# Patient Record
Sex: Male | Born: 1955 | Race: White | Hispanic: No | Marital: Married | Smoking: Former smoker
Health system: Southern US, Community
[De-identification: ages and names within clinical notes are randomized; demographics above are authoritative.]

## PROBLEM LIST (undated history)

## (undated) DIAGNOSIS — I10 Essential (primary) hypertension: Secondary | ICD-10-CM

## (undated) DIAGNOSIS — M1990 Unspecified osteoarthritis, unspecified site: Secondary | ICD-10-CM

## (undated) DIAGNOSIS — I499 Cardiac arrhythmia, unspecified: Secondary | ICD-10-CM

## (undated) DIAGNOSIS — G4730 Sleep apnea, unspecified: Secondary | ICD-10-CM

## (undated) DIAGNOSIS — E785 Hyperlipidemia, unspecified: Secondary | ICD-10-CM

## (undated) DIAGNOSIS — J45909 Unspecified asthma, uncomplicated: Secondary | ICD-10-CM

## (undated) HISTORY — DX: Essential (primary) hypertension: I10

## (undated) HISTORY — DX: Unspecified asthma, uncomplicated: J45.909

## (undated) HISTORY — PX: SINUS EXPLORATION: SHX5214

## (undated) HISTORY — PX: HERNIA REPAIR: SHX51

## (undated) HISTORY — DX: Hyperlipidemia, unspecified: E78.5

## (undated) HISTORY — PX: URETEROSCOPY: SHX842

---

## 1998-01-05 ENCOUNTER — Ambulatory Visit (HOSPITAL_BASED_OUTPATIENT_CLINIC_OR_DEPARTMENT_OTHER): Admission: RE | Admit: 1998-01-05 | Discharge: 1998-01-05 | Payer: Self-pay | Admitting: *Deleted

## 2012-04-30 ENCOUNTER — Other Ambulatory Visit: Payer: Self-pay

## 2012-04-30 ENCOUNTER — Ambulatory Visit
Admission: RE | Admit: 2012-04-30 | Discharge: 2012-04-30 | Disposition: A | Discharge status: Home / Self Care | Payer: 59 | Source: Ambulatory Visit

## 2012-04-30 DIAGNOSIS — L089 Local infection of the skin and subcutaneous tissue, unspecified: Secondary | ICD-10-CM

## 2013-02-21 ENCOUNTER — Encounter (INDEPENDENT_AMBULATORY_CARE_PROVIDER_SITE_OTHER): Payer: Self-pay | Admitting: General Surgery

## 2013-02-21 ENCOUNTER — Ambulatory Visit (INDEPENDENT_AMBULATORY_CARE_PROVIDER_SITE_OTHER): Payer: 59 | Admitting: General Surgery

## 2013-02-21 ENCOUNTER — Encounter (INDEPENDENT_AMBULATORY_CARE_PROVIDER_SITE_OTHER): Payer: Self-pay

## 2013-02-21 VITALS — BP 140/88 | HR 78 | Temp 97.0°F | Resp 14 | Ht 69.0 in | Wt 279.4 lb

## 2013-02-21 DIAGNOSIS — M6208 Separation of muscle (nontraumatic), other site: Secondary | ICD-10-CM

## 2013-02-21 DIAGNOSIS — M6200 Separation of muscle (nontraumatic), unspecified site: Secondary | ICD-10-CM

## 2013-02-21 NOTE — Patient Instructions (Signed)
The bulge that you see in your upper abdomen is not a hernia. 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  It is not a hernia, it should not cause pain, and it does not require surgery. Nothing further needs to be done.  Return to see Dr. Derrell Lolling as needed.

## 2013-02-21 NOTE — Progress Notes (Signed)
Patient ID: Donald [name], male   DOB: [birth year], 57 y.o.   MRN: [id number]  Chief Complaint  Patient presents with  . New Evaluation    eval abd hernia    HPI Donald [name] is a 57 y.o. male.  He is basically self-referred for a bulge in his upper abdomen.  He has noticed a bulge in his upper abdomen for some time. He notices this when he sits up but not when he is standing. There is no pain. It is not particularly progressive.  Past history of laparoscopic left inguinal hernia repair, possibly by Dr. Luan Pulling in the year 2000. This recurred and he went away for cement of the repair which has failed.  Past history significant for psoriasis, hypertension, asthma, hyperlipidemia, and obesity.  HPI  Past Medical History  Diagnosis Date  . Hyperlipidemia   . Hypertension   . Asthma     Past Surgical History  Procedure Laterality Date  . Sinus exploration    . Hernia repair      History reviewed. No pertinent family history.  Social History History  Substance Use Topics  . Smoking status: Former Smoker    Types: Cigarettes  . Smokeless tobacco: Never Used     Comment: patient quit smoking around 25  . Alcohol Use: Yes    Allergies  Allergen Reactions  . Prednisone Swelling  . Tetracyclines & Related Nausea Only    Current Outpatient Prescriptions  Medication Sig Dispense Refill  . amLODipine (NORVASC) 10 MG tablet       . hydrochlorothiazide (HYDRODIURIL) 25 MG tablet       . ibuprofen (ADVIL,MOTRIN) 200 MG tablet Take 200 mg by mouth every 6 (six) hours as needed for pain.      Marland Kitchen lisinopril (PRINIVIL,ZESTRIL) 40 MG tablet       . VECTICAL 3 MCG/GM cream        No current facility-administered medications for this visit.    Review of Systems Review of Systems  Constitutional: Negative for fever, chills and unexpected weight change.  HENT: Negative for congestion, hearing loss, sore throat, trouble swallowing and voice change.   Eyes: Negative for  visual disturbance.  Respiratory: Negative for cough and wheezing.   Cardiovascular: Negative for chest pain, palpitations and leg swelling.  Gastrointestinal: Negative for nausea, vomiting, abdominal pain, diarrhea, constipation, blood in stool, abdominal distention, anal bleeding and rectal pain.  Genitourinary: Negative for hematuria and difficulty urinating.  Musculoskeletal: Negative for arthralgias.  Skin: Negative for rash and wound.  Neurological: Negative for seizures, syncope, weakness and headaches.  Hematological: Negative for adenopathy. Does not bruise/bleed easily.  Psychiatric/Behavioral: Negative for confusion.    Blood pressure 140/88, pulse 78, temperature 97 F (36.1 C), temperature source Temporal, resp. rate 14, height 5\' 9"  (1.753 m), weight 279 lb 6.4 oz (126.735 kg).  Physical Exam Physical Exam  Constitutional: He is oriented to person, place, and time. He appears well-developed and well-nourished. No distress.  HENT:  Head: Normocephalic.  Nose: Nose normal.  Mouth/Throat: No oropharyngeal exudate.  Eyes: Conjunctivae and EOM are normal. Pupils are equal, round, and reactive to light. Right eye exhibits no discharge. Left eye exhibits no discharge. No scleral icterus.  Neck: Normal range of motion. Neck supple. No JVD present. No tracheal deviation present. No thyromegaly present.  Cardiovascular: Normal rate, regular rhythm, normal heart sounds and intact distal pulses.   No murmur heard. Pulmonary/Chest: Effort normal and breath sounds normal. No stridor. No respiratory distress.  He has no wheezes. He has no rales. He exhibits no tenderness.  Typical bulge of diastases recti in upper abdomen notable when lifting his head from supine position. This bulge does not present when he is standing or with cough or Valsalva. Small patch of psoriasis above the umbilicus. Small transverse incision below the umbilicus. No hernia there. Central obesity.  Abdominal: Soft.  Bowel sounds are normal. He exhibits no distension and no mass. There is no tenderness. There is no rebound and no guarding.  Genitourinary:  No inguinal hernia  Musculoskeletal: Normal range of motion. He exhibits no edema and no tenderness.  Lymphadenopathy:    He has no cervical adenopathy.  Neurological: He is alert and oriented to person, place, and time. He has normal reflexes. Coordination normal.  Skin: Skin is warm and dry. No rash noted. He is not diaphoretic. No erythema. No pallor.  Psychiatric: He has a normal mood and affect. His behavior is normal. Judgment and thought content normal.    Data Reviewed None  Assessment    Diastases recti. Asymptomatic.  Central obesity  Hypertension  Asthma  Hyperlipidemia  Psoriasis  Past history laparoscopic repair left inguinal hernia, probably about recurrence, necessitating subsequent open repair. No evidence of recurrence at this time.     Plan    I spent  some time discussing the diagnosis of a diastases recti with him. I discussed the anatomy and drew  pictures for him. He is aware that nothing further needs to be done about this, and is very comfortable with that  Return to see me as needed.        Angelia Mould. Derrell Lolling, M.D., [facility] General and Minimally invasive Surgery Breast and Colorectal Surgery Office:   [phone] Pager:   [phone]  02/21/2013, 12:06 PM

## 2013-11-29 ENCOUNTER — Ambulatory Visit (HOSPITAL_BASED_OUTPATIENT_CLINIC_OR_DEPARTMENT_OTHER): Payer: 59 | Attending: Family Medicine | Admitting: Radiology

## 2013-11-29 VITALS — Ht 69.0 in | Wt 275.0 lb

## 2013-11-29 DIAGNOSIS — G4733 Obstructive sleep apnea (adult) (pediatric): Secondary | ICD-10-CM | POA: Diagnosis not present

## 2013-12-04 DIAGNOSIS — G4733 Obstructive sleep apnea (adult) (pediatric): Secondary | ICD-10-CM

## 2013-12-04 NOTE — Sleep Study (Signed)
Hypersomnia with sleep apnea      NAME: Donald [name] DATE OF BIRTH:  December 26, 1955 MEDICAL RECORD NUMBER [id number]  LOCATION: [facility]  PHYSICIAN: Brydan Downard [name]  DATE OF STUDY: 11/29/2013  SLEEP STUDY TYPE: Nocturnal Polysomnogram               REFERRING PHYSICIAN: Farris HasMorrow, Aaron, MD  INDICATION FOR STUDY: Hypersomnia with sleep apnea  EPWORTH SLEEPINESS SCORE:   11/24 HEIGHT: 5\' 9"  (175.3 cm)  WEIGHT: 275 lb (124.739 kg)    Body mass index is 40.59 kg/(m^2).  NECK SIZE: 18.5 in.  MEDICATIONS: Charted for review  SLEEP ARCHITECTURE: Split study protocol. During the diagnostic phase, total sleep time 120.5 minutes with sleep efficiency 61.5%. Stage I was 13.7%, stage II 81.7%, stage III absent, REM 4.6% of total sleep time. Sleep latency 40.5 minutes, REM latency 106 minutes, awake after sleep onset 39 minutes, arousal index 69.7, bedtime medication: None  RESPIRATORY DATA: Apnea hypopneas index (AHI) 93.6 per hour. 188 total events scored including 115 obstructive apneas, 73 hypopneas. Events were not positional. REM AHI 87.3 per hour. CPAP titrated to 10 CWP, AHI 0 per hour. He wore a medium fullface mask.  OXYGEN DATA: Moderately loud snoring before CPAP with oxygen desaturation to a nadir of 74% on room air. With CPAP control, snoring was prevented and mean oxygen saturation was 94.6% on room air.  CARDIAC DATA: Normal sinus rhythm  MOVEMENT/PARASOMNIA: During the diagnostic phase, 25 limb jerks were counted of which 3 were associated with arousal or wakening for a periodic limb movement with arousal index of 1.5 per hour. During the titration phase, 173 limb jerks were counted of which 5 were associated with arousal or wakening for a periodic limb movement with arousal index of 1.8 per hour. Bathroom x1  IMPRESSION/ RECOMMENDATION:   1) Severe obstructive sleep apnea/hypopneas syndrome, AHI 93.6 per hour with non-positional events. REM AHI 87.3 per hour.  Moderately loud snoring with oxygen desaturation to a nadir of 74% on room air. 2) Successful CPAP titration to 10 CWP, AHI 0 per hour. He wore a medium ResMed AirFit F10 fullface mask with heated humidifier. Snoring was prevented and mean oxygen saturation held 94.6% on room air.   Waymon BudgeYOUNG,Donald [name] Diplomate, American Board of Sleep Medicine  ELECTRONICALLY SIGNED ON:  12/04/2013, 11:31 AM [facility] PH: [phone]   FX: [phone] ACCREDITED BY THE AMERICAN ACADEMY OF SLEEP MEDICINE

## 2014-02-22 ENCOUNTER — Other Ambulatory Visit (HOSPITAL_COMMUNITY): Payer: Self-pay | Admitting: Orthopaedic Surgery

## 2014-03-01 NOTE — Patient Instructions (Addendum)
Donald [name]  03/01/2014   Your procedure is scheduled on:  03/10/2014    Come thru the Cancer Center Entrance.    Follow the Signs to Short Stay Center at  0745      am  Call this number if you have problems the morning of surgery: [phone]   Remember:   Do not eat food or drink liquids after midnight.   Take these medicines the morning of surgery with A SIP OF WATER:Albuterol Inhaler if needed, Bring Inhaler with you, Amlodipine ( NOrvasc) , Hydrocodone if needed, Ocean Nasal Spray if needed     Do not wear jewelry,  Do not wear lotions, powders, or perfumes. deodorant.  . Men may shave face and neck.  Do not bring valuables to the hospital.  Contacts, dentures or bridgework may not be worn into surgery.  Leave suitcase in the car. After surgery it may be brought to your room.  For patients admitted to the hospital, checkout time is 11:00 AM the day of  discharge.          Please read over the following fact sheets that you were given: MRSA Information, coughing and deep breathing exercises, leg exercises            [place] - Preparing for Surgery Before surgery, you can play an important role.  Because skin is not sterile, your skin needs to be as free of germs as possible.  You can reduce the number of germs on your skin by washing with CHG (chlorahexidine gluconate) soap before surgery.  CHG is an antiseptic cleaner which kills germs and bonds with the skin to continue killing germs even after washing. Please DO NOT use if you have an allergy to CHG or antibacterial soaps.  If your skin becomes reddened/irritated stop using the CHG and inform your nurse when you arrive at Short Stay. Do not shave (including legs and underarms) for at least 48 hours prior to the first CHG shower.  You may shave your face/neck. Please follow these instructions carefully:  1.  Shower with CHG Soap the night before surgery and the  morning of Surgery.  2.  If you choose to wash your hair,  wash your hair first as usual with your  normal  shampoo.  3.  After you shampoo, rinse your hair and body thoroughly to remove the  shampoo.                           4.  Use CHG as you would any other liquid soap.  You can apply chg directly  to the skin and wash                       Gently with a scrungie or clean washcloth.  5.  Apply the CHG Soap to your body ONLY FROM THE NECK DOWN.   Do not use on face/ open                           Wound or open sores. Avoid contact with eyes, ears mouth and genitals (private parts).                       Wash face,  Genitals (private parts) with your normal soap.             6.  Wash thoroughly, paying  special attention to the area where your surgery  will be performed.  7.  Thoroughly rinse your body with warm water from the neck down.  8.  DO NOT shower/wash with your normal soap after using and rinsing off  the CHG Soap.                9.  Pat yourself dry with a clean towel.            10.  Wear clean pajamas.            11.  Place clean sheets on your bed the night of your first shower and do not  sleep with pets. Day of Surgery : Do not apply any lotions/deodorants the morning of surgery.  Please wear clean clothes to the hospital/surgery center.  FAILURE TO FOLLOW THESE INSTRUCTIONS MAY RESULT IN THE CANCELLATION OF YOUR SURGERY PATIENT SIGNATURE_________________________________  NURSE SIGNATURE__________________________________  ________________________________________________________________________  WHAT IS A BLOOD TRANSFUSION? Blood Transfusion Information  A transfusion is the replacement of blood or some of its parts. Blood is made up of multiple cells which provide different functions.  Red blood cells carry oxygen and are used for blood loss replacement.  White blood cells fight against infection.  Platelets control bleeding.  Plasma helps clot blood.  Other blood products are available for specialized needs, such as  hemophilia or other clotting disorders. BEFORE THE TRANSFUSION  Who gives blood for transfusions?   Healthy volunteers who are fully evaluated to make sure their blood is safe. This is blood bank blood. Transfusion therapy is the safest it has ever been in the practice of medicine. Before blood is taken from a donor, a complete history is taken to make sure that person has no history of diseases nor engages in risky social behavior (examples are intravenous drug use or sexual activity with multiple partners). The donor's travel history is screened to minimize risk of transmitting infections, such as malaria. The donated blood is tested for signs of infectious diseases, such as HIV and hepatitis. The blood is then tested to be sure it is compatible with you in order to minimize the chance of a transfusion reaction. If you or a relative donates blood, this is often done in anticipation of surgery and is not appropriate for emergency situations. It takes many days to process the donated blood. RISKS AND COMPLICATIONS Although transfusion therapy is very safe and saves many lives, the main dangers of transfusion include:   Getting an infectious disease.  Developing a transfusion reaction. This is an allergic reaction to something in the blood you were given. Every precaution is taken to prevent this. The decision to have a blood transfusion has been considered carefully by your caregiver before blood is given. Blood is not given unless the benefits outweigh the risks. AFTER THE TRANSFUSION  Right after receiving a blood transfusion, you will usually feel much better and more energetic. This is especially true if your red blood cells have gotten low (anemic). The transfusion raises the level of the red blood cells which carry oxygen, and this usually causes an energy increase.  The nurse administering the transfusion will monitor you carefully for complications. HOME CARE INSTRUCTIONS  No special  instructions are needed after a transfusion. You may find your energy is better. Speak with your caregiver about any limitations on activity for underlying diseases you may have. SEEK MEDICAL CARE IF:   Your condition is not improving after your transfusion.  You develop redness  or irritation at the intravenous (IV) site. SEEK IMMEDIATE MEDICAL CARE IF:  Any of the following symptoms occur over the next 12 hours:  Shaking chills.  You have a temperature by mouth above 102 F (38.9 C), not controlled by medicine.  Chest, back, or muscle pain.  People around you feel you are not acting correctly or are confused.  Shortness of breath or difficulty breathing.  Dizziness and fainting.  You get a rash or develop hives.  You have a decrease in urine output.  Your urine turns a dark color or changes to pink, red, or brown. Any of the following symptoms occur over the next 10 days:  You have a temperature by mouth above 102 F (38.9 C), not controlled by medicine.  Shortness of breath.  Weakness after normal activity.  The Waner part of the eye turns yellow (jaundice).  You have a decrease in the amount of urine or are urinating less often.  Your urine turns a dark color or changes to pink, red, or brown. Document Released: 04/11/2000 Document Revised: 07/07/2011 Document Reviewed: 11/29/2007 [facility] Patient Information 2014 [place], [place].  _______________________________________________________________________

## 2014-03-02 ENCOUNTER — Ambulatory Visit (HOSPITAL_COMMUNITY)
Admission: RE | Admit: 2014-03-02 | Discharge: 2014-03-02 | Disposition: A | Discharge status: Home / Self Care | Payer: 59 | Source: Ambulatory Visit | Attending: Orthopaedic Surgery | Admitting: Orthopaedic Surgery

## 2014-03-02 ENCOUNTER — Encounter (HOSPITAL_COMMUNITY): Payer: Self-pay

## 2014-03-02 ENCOUNTER — Encounter (HOSPITAL_COMMUNITY)
Admission: RE | Admit: 2014-03-02 | Discharge: 2014-03-02 | Disposition: A | Discharge status: Home / Self Care | Payer: 59 | Source: Ambulatory Visit | Attending: Orthopaedic Surgery | Admitting: Orthopaedic Surgery

## 2014-03-02 DIAGNOSIS — Z01818 Encounter for other preprocedural examination: Secondary | ICD-10-CM | POA: Insufficient documentation

## 2014-03-02 DIAGNOSIS — I10 Essential (primary) hypertension: Secondary | ICD-10-CM | POA: Diagnosis not present

## 2014-03-02 HISTORY — DX: Sleep apnea, unspecified: G47.30

## 2014-03-02 LAB — CBC
HEMATOCRIT: 43.4 % (ref 39.0–52.0)
Hemoglobin: 14.8 g/dL (ref 13.0–17.0)
MCH: 32.1 pg (ref 26.0–34.0)
MCHC: 34.1 g/dL (ref 30.0–36.0)
MCV: 94.1 fL (ref 78.0–100.0)
Platelets: 211 10*3/uL (ref 150–400)
RBC: 4.61 MIL/uL (ref 4.22–5.81)
RDW: 12.3 % (ref 11.5–15.5)
WBC: 7.7 10*3/uL (ref 4.0–10.5)

## 2014-03-02 LAB — BASIC METABOLIC PANEL
ANION GAP: 11 (ref 5–15)
BUN: 13 mg/dL (ref 6–23)
CALCIUM: 11.1 mg/dL — AB (ref 8.4–10.5)
CO2: 26 meq/L (ref 19–32)
Chloride: 93 mEq/L — ABNORMAL LOW (ref 96–112)
Creatinine, Ser: 0.81 mg/dL (ref 0.50–1.35)
GFR calc Af Amer: >90 mL/min (ref >90)
GFR calc non Af Amer: >90 mL/min (ref >90)
GLUCOSE: 101 mg/dL — AB (ref 70–99)
Potassium: 3.7 mEq/L (ref 3.7–5.3)
SODIUM: 130 meq/L — AB (ref 137–147)

## 2014-03-02 LAB — PROTIME-INR
INR: 1 (ref 0.00–1.49)
PROTHROMBIN TIME: 13.3 s (ref 11.6–15.2)

## 2014-03-02 LAB — URINALYSIS, ROUTINE W REFLEX MICROSCOPIC
Bilirubin Urine: NEGATIVE
Glucose, UA: NEGATIVE mg/dL
HGB URINE DIPSTICK: NEGATIVE
Ketones, ur: NEGATIVE mg/dL
NITRITE: NEGATIVE
PH: 7 (ref 5.0–8.0)
Protein, ur: NEGATIVE mg/dL
SPECIFIC GRAVITY, URINE: 1.013 (ref 1.005–1.030)
Urobilinogen, UA: 0.2 mg/dL (ref 0.0–1.0)

## 2014-03-02 LAB — URINE MICROSCOPIC-ADD ON

## 2014-03-02 LAB — APTT: aPTT: 28 seconds (ref 24–37)

## 2014-03-02 LAB — SURGICAL PCR SCREEN
MRSA, PCR: NEGATIVE
STAPHYLOCOCCUS AUREUS: NEGATIVE

## 2014-03-02 NOTE — Progress Notes (Signed)
Bmet, ua, micro results faxed to dr Cristal Deerchristopher blackman

## 2014-03-02 NOTE — Progress Notes (Signed)
Sleep Study documents 12/2013 in EPIC.

## 2014-03-03 NOTE — Progress Notes (Signed)
Urinalysis and micro results along with BMP results done 03/02/2014 sent via EPIC to Dr Doneen Poissonhristopher Blackman.

## 2014-03-09 MED ORDER — DEXTROSE 5 % IV SOLN
3.0000 g | INTRAVENOUS | Status: AC
  Administered 2014-03-10: 3000 mg via INTRAVENOUS
  Filled 2014-03-09: qty 3000

## 2014-03-09 NOTE — Anesthesia Preprocedure Evaluation (Signed)
Anesthesia Evaluation  Patient identified by MRN, date of birth, ID band Patient awake    Reviewed: Allergy & Precautions, H&P , NPO status , Patient's Chart, lab work & pertinent test results  History of Anesthesia Complications (+) history of anesthetic complications  Airway Mallampati: II  TM Distance: >3 FB Neck ROM: Full    Dental no notable dental hx. (+) Dental Advisory Given   Pulmonary asthma , sleep apnea , former smoker,  breath sounds clear to auscultation  Pulmonary exam normal       Cardiovascular hypertension, Pt. on medications Rhythm:Regular Rate:Normal     Neuro/Psych negative neurological ROS  negative psych ROS   GI/Hepatic negative GI ROS, Neg liver ROS,   Endo/Other  Morbid obesity  Renal/GU negative Renal ROS  negative genitourinary   Musculoskeletal negative musculoskeletal ROS (+)   Abdominal   Peds negative pediatric ROS (+)  Hematology negative hematology ROS (+)   Anesthesia Other Findings   Reproductive/Obstetrics negative OB ROS                             Anesthesia Physical Anesthesia Plan  ASA: III  Anesthesia Plan: General and Spinal   Post-op Pain Management:    Induction: Intravenous  Airway Management Planned: Oral ETT and Nasal Cannula  Additional Equipment:   Intra-op Plan:   Post-operative Plan: Extubation in OR  Informed Consent: I have reviewed the patients History and Physical, chart, labs and discussed the procedure including the risks, benefits and alternatives for the proposed anesthesia with the patient or authorized representative who has indicated his/her understanding and acceptance.   Dental advisory given  Plan Discussed with: CRNA  Anesthesia Plan Comments:         Anesthesia Quick Evaluation

## 2014-03-10 ENCOUNTER — Ambulatory Visit (HOSPITAL_COMMUNITY): Payer: 59 | Admitting: Anesthesiology

## 2014-03-10 ENCOUNTER — Inpatient Hospital Stay (HOSPITAL_COMMUNITY)
Admission: RE | Admit: 2014-03-10 | Discharge: 2014-03-11 | DRG: 470 | Disposition: A | Discharge status: Home Health | Payer: 59 | Source: Ambulatory Visit | Attending: Orthopaedic Surgery | Admitting: Orthopaedic Surgery

## 2014-03-10 ENCOUNTER — Ambulatory Visit (HOSPITAL_COMMUNITY): Payer: 59

## 2014-03-10 ENCOUNTER — Inpatient Hospital Stay (HOSPITAL_COMMUNITY): Payer: 59

## 2014-03-10 ENCOUNTER — Encounter (HOSPITAL_COMMUNITY)
Admission: RE | Disposition: A | Discharge status: Home Health | Payer: Self-pay | Source: Ambulatory Visit | Attending: Orthopaedic Surgery

## 2014-03-10 ENCOUNTER — Encounter (HOSPITAL_COMMUNITY): Payer: Self-pay | Admitting: *Deleted

## 2014-03-10 DIAGNOSIS — E785 Hyperlipidemia, unspecified: Secondary | ICD-10-CM | POA: Diagnosis present

## 2014-03-10 DIAGNOSIS — M659 Synovitis and tenosynovitis, unspecified: Secondary | ICD-10-CM | POA: Diagnosis present

## 2014-03-10 DIAGNOSIS — E669 Obesity, unspecified: Secondary | ICD-10-CM | POA: Diagnosis present

## 2014-03-10 DIAGNOSIS — Z6841 Body Mass Index (BMI) 40.0 and over, adult: Secondary | ICD-10-CM | POA: Diagnosis not present

## 2014-03-10 DIAGNOSIS — I10 Essential (primary) hypertension: Secondary | ICD-10-CM | POA: Diagnosis present

## 2014-03-10 DIAGNOSIS — M1611 Unilateral primary osteoarthritis, right hip: Secondary | ICD-10-CM | POA: Diagnosis present

## 2014-03-10 DIAGNOSIS — G4730 Sleep apnea, unspecified: Secondary | ICD-10-CM | POA: Diagnosis present

## 2014-03-10 DIAGNOSIS — Z87891 Personal history of nicotine dependence: Secondary | ICD-10-CM | POA: Diagnosis not present

## 2014-03-10 DIAGNOSIS — J9811 Atelectasis: Secondary | ICD-10-CM | POA: Diagnosis not present

## 2014-03-10 DIAGNOSIS — Z96641 Presence of right artificial hip joint: Secondary | ICD-10-CM

## 2014-03-10 DIAGNOSIS — M25551 Pain in right hip: Secondary | ICD-10-CM | POA: Diagnosis present

## 2014-03-10 DIAGNOSIS — Z419 Encounter for procedure for purposes other than remedying health state, unspecified: Secondary | ICD-10-CM

## 2014-03-10 HISTORY — PX: TOTAL HIP ARTHROPLASTY: SHX124

## 2014-03-10 LAB — ABO/RH: ABO/RH(D): O POS

## 2014-03-10 LAB — TYPE AND SCREEN
ABO/RH(D): O POS
Antibody Screen: NEGATIVE

## 2014-03-10 SURGERY — ARTHROPLASTY, HIP, TOTAL, ANTERIOR APPROACH
Anesthesia: General | Site: Hip | Laterality: Right

## 2014-03-10 MED ORDER — ACETAMINOPHEN 325 MG PO TABS: 650.0000 mg | ORAL_TABLET | Freq: Four times a day (QID) | ORAL | Status: DC | PRN

## 2014-03-10 MED ORDER — PHENOL 1.4 % MT LIQD
1.0000 | OROMUCOSAL | Status: DC | PRN
  Filled 2014-03-10: qty 177

## 2014-03-10 MED ORDER — MIDAZOLAM HCL 2 MG/2ML IJ SOLN
INTRAMUSCULAR | Status: AC
  Filled 2014-03-10: qty 2

## 2014-03-10 MED ORDER — HYDROMORPHONE HCL 1 MG/ML IJ SOLN
INTRAMUSCULAR | Status: DC | PRN
  Administered 2014-03-10 (×2): 1 mg via INTRAVENOUS

## 2014-03-10 MED ORDER — ROCURONIUM BROMIDE 100 MG/10ML IV SOLN
INTRAVENOUS | Status: DC | PRN
  Administered 2014-03-10 (×2): 30 mg via INTRAVENOUS

## 2014-03-10 MED ORDER — METHOCARBAMOL 500 MG PO TABS: 500.0000 mg | ORAL_TABLET | Freq: Four times a day (QID) | ORAL | Status: DC | PRN

## 2014-03-10 MED ORDER — ONDANSETRON HCL 4 MG/2ML IJ SOLN
INTRAMUSCULAR | Status: DC | PRN
  Administered 2014-03-10: 4 mg via INTRAVENOUS

## 2014-03-10 MED ORDER — FENTANYL CITRATE 0.05 MG/ML IJ SOLN
INTRAMUSCULAR | Status: DC | PRN
  Administered 2014-03-10 (×2): 100 ug via INTRAVENOUS
  Administered 2014-03-10: 50 ug via INTRAVENOUS

## 2014-03-10 MED ORDER — SODIUM CHLORIDE 0.9 % IR SOLN
Status: DC | PRN
  Administered 2014-03-10: 1000 mL

## 2014-03-10 MED ORDER — LIDOCAINE HCL (CARDIAC) 20 MG/ML IV SOLN
INTRAVENOUS | Status: DC | PRN
  Administered 2014-03-10: 50 mg via INTRAVENOUS

## 2014-03-10 MED ORDER — FENTANYL CITRATE 0.05 MG/ML IJ SOLN
INTRAMUSCULAR | Status: AC
  Filled 2014-03-10: qty 2

## 2014-03-10 MED ORDER — ROCURONIUM BROMIDE 100 MG/10ML IV SOLN
INTRAVENOUS | Status: AC
  Filled 2014-03-10: qty 1

## 2014-03-10 MED ORDER — CALCITRIOL 3 MCG/GM EX OINT: 1.0000 "application " | TOPICAL_OINTMENT | Freq: Every day | CUTANEOUS | Status: DC | PRN

## 2014-03-10 MED ORDER — ONDANSETRON HCL 4 MG/2ML IJ SOLN
4.0000 mg | Freq: Once | INTRAMUSCULAR | Status: DC | PRN
Start: 2014-03-10 — End: 2014-03-10

## 2014-03-10 MED ORDER — NEOSTIGMINE METHYLSULFATE 10 MG/10ML IV SOLN
INTRAVENOUS | Status: AC
  Filled 2014-03-10: qty 1

## 2014-03-10 MED ORDER — HYDROMORPHONE HCL 1 MG/ML IJ SOLN
1.0000 mg | INTRAMUSCULAR | Status: DC | PRN
  Administered 2014-03-10 (×2): 1 mg via INTRAVENOUS
  Filled 2014-03-10 (×2): qty 1

## 2014-03-10 MED ORDER — METOCLOPRAMIDE HCL 5 MG/ML IJ SOLN: 5.0000 mg | Freq: Three times a day (TID) | INTRAMUSCULAR | Status: DC | PRN

## 2014-03-10 MED ORDER — RIVAROXABAN 10 MG PO TABS
10.0000 mg | ORAL_TABLET | Freq: Every day | ORAL | Status: DC
  Administered 2014-03-11: 10 mg via ORAL
  Filled 2014-03-10 (×2): qty 1

## 2014-03-10 MED ORDER — METOCLOPRAMIDE HCL 10 MG PO TABS: 5.0000 mg | ORAL_TABLET | Freq: Three times a day (TID) | ORAL | Status: DC | PRN

## 2014-03-10 MED ORDER — MENTHOL 3 MG MT LOZG
1.0000 | LOZENGE | OROMUCOSAL | Status: DC | PRN
  Filled 2014-03-10: qty 9

## 2014-03-10 MED ORDER — ONDANSETRON HCL 4 MG/2ML IJ SOLN: 4.0000 mg | Freq: Four times a day (QID) | INTRAMUSCULAR | Status: DC | PRN

## 2014-03-10 MED ORDER — PROPOFOL 10 MG/ML IV BOLUS
INTRAVENOUS | Status: AC
Start: 2014-03-10 — End: 2014-03-10
  Filled 2014-03-10: qty 20

## 2014-03-10 MED ORDER — ALBUTEROL SULFATE (2.5 MG/3ML) 0.083% IN NEBU: 2.5000 mg | INHALATION_SOLUTION | Freq: Four times a day (QID) | RESPIRATORY_TRACT | Status: DC | PRN

## 2014-03-10 MED ORDER — ADULT MULTIVITAMIN W/MINERALS CH: 1.0000 | ORAL_TABLET | Freq: Every morning | ORAL | Status: DC

## 2014-03-10 MED ORDER — SUCCINYLCHOLINE CHLORIDE 20 MG/ML IJ SOLN
INTRAMUSCULAR | Status: DC | PRN
  Administered 2014-03-10: 100 mg via INTRAVENOUS

## 2014-03-10 MED ORDER — AMLODIPINE BESYLATE 10 MG PO TABS
10.0000 mg | ORAL_TABLET | Freq: Every morning | ORAL | Status: DC
  Administered 2014-03-11: 10 mg via ORAL
  Filled 2014-03-10: qty 1

## 2014-03-10 MED ORDER — PROPOFOL 10 MG/ML IV BOLUS
INTRAVENOUS | Status: DC | PRN
  Administered 2014-03-10: 200 mg via INTRAVENOUS

## 2014-03-10 MED ORDER — TRANEXAMIC ACID 100 MG/ML IV SOLN
1000.0000 mg | INTRAVENOUS | Status: AC
  Administered 2014-03-10: 1000 mg via INTRAVENOUS
  Filled 2014-03-10: qty 10

## 2014-03-10 MED ORDER — SODIUM CHLORIDE 0.9 % IV SOLN
INTRAVENOUS | Status: DC
  Administered 2014-03-10: 75 mL/h via INTRAVENOUS

## 2014-03-10 MED ORDER — ONDANSETRON HCL 4 MG PO TABS
4.0000 mg | ORAL_TABLET | Freq: Four times a day (QID) | ORAL | Status: DC | PRN
Start: 2014-03-10 — End: 2014-03-11

## 2014-03-10 MED ORDER — LACTATED RINGERS IV SOLN
INTRAVENOUS | Status: DC
  Administered 2014-03-10 (×2): 1000 mL via INTRAVENOUS
  Administered 2014-03-10: 11:00:00 via INTRAVENOUS

## 2014-03-10 MED ORDER — DEXTROSE 5 % IV SOLN
500.0000 mg | Freq: Four times a day (QID) | INTRAVENOUS | Status: DC | PRN
  Administered 2014-03-10: 500 mg via INTRAVENOUS
  Filled 2014-03-10 (×2): qty 5

## 2014-03-10 MED ORDER — ONDANSETRON HCL 4 MG/2ML IJ SOLN
INTRAMUSCULAR | Status: AC
  Filled 2014-03-10: qty 2

## 2014-03-10 MED ORDER — NEOSTIGMINE METHYLSULFATE 10 MG/10ML IV SOLN
INTRAVENOUS | Status: DC | PRN
  Administered 2014-03-10: 4 mg via INTRAVENOUS

## 2014-03-10 MED ORDER — GLYCOPYRROLATE 0.2 MG/ML IJ SOLN
INTRAMUSCULAR | Status: DC | PRN
  Administered 2014-03-10: .6 mg via INTRAVENOUS

## 2014-03-10 MED ORDER — HYDROCHLOROTHIAZIDE 25 MG PO TABS
25.0000 mg | ORAL_TABLET | Freq: Every morning | ORAL | Status: DC
  Administered 2014-03-10 – 2014-03-11 (×2): 25 mg via ORAL
  Filled 2014-03-10 (×2): qty 1

## 2014-03-10 MED ORDER — DIPHENHYDRAMINE HCL 12.5 MG/5ML PO ELIX: 12.5000 mg | ORAL_SOLUTION | ORAL | Status: DC | PRN

## 2014-03-10 MED ORDER — FENTANYL CITRATE 0.05 MG/ML IJ SOLN
INTRAMUSCULAR | Status: AC
  Filled 2014-03-10: qty 5

## 2014-03-10 MED ORDER — ZOLPIDEM TARTRATE 5 MG PO TABS: 5.0000 mg | ORAL_TABLET | Freq: Every evening | ORAL | Status: DC | PRN

## 2014-03-10 MED ORDER — FERROUS SULFATE 325 (65 FE) MG PO TABS
325.0000 mg | ORAL_TABLET | Freq: Three times a day (TID) | ORAL | Status: DC
  Administered 2014-03-11 (×2): 325 mg via ORAL
  Filled 2014-03-10 (×6): qty 1

## 2014-03-10 MED ORDER — GLYCOPYRROLATE 0.2 MG/ML IJ SOLN
INTRAMUSCULAR | Status: AC
  Filled 2014-03-10: qty 3

## 2014-03-10 MED ORDER — HYDROMORPHONE HCL 2 MG/ML IJ SOLN
INTRAMUSCULAR | Status: AC
Start: 2014-03-10 — End: 2014-03-10
  Filled 2014-03-10: qty 1

## 2014-03-10 MED ORDER — 0.9 % SODIUM CHLORIDE (POUR BTL) OPTIME
TOPICAL | Status: DC | PRN
  Administered 2014-03-10: 1000 mL

## 2014-03-10 MED ORDER — ACETAMINOPHEN 650 MG RE SUPP: 650.0000 mg | Freq: Four times a day (QID) | RECTAL | Status: DC | PRN

## 2014-03-10 MED ORDER — ALUM & MAG HYDROXIDE-SIMETH 200-200-20 MG/5ML PO SUSP: 30.0000 mL | ORAL | Status: DC | PRN

## 2014-03-10 MED ORDER — FENTANYL CITRATE 0.05 MG/ML IJ SOLN
25.0000 ug | INTRAMUSCULAR | Status: DC | PRN
  Administered 2014-03-10 (×3): 50 ug via INTRAVENOUS

## 2014-03-10 MED ORDER — MIDAZOLAM HCL 5 MG/5ML IJ SOLN
INTRAMUSCULAR | Status: DC | PRN
  Administered 2014-03-10: 2 mg via INTRAVENOUS

## 2014-03-10 MED ORDER — CALCIPOTRIENE-BETAMETH DIPROP 0.005-0.064 % EX SUSP: 1.0000 "application " | Freq: Every day | CUTANEOUS | Status: DC | PRN

## 2014-03-10 MED ORDER — OXYCODONE HCL 5 MG PO TABS
5.0000 mg | ORAL_TABLET | ORAL | Status: DC | PRN
  Administered 2014-03-10: 15 mg via ORAL
  Administered 2014-03-10: 10 mg via ORAL
  Administered 2014-03-10 – 2014-03-11 (×6): 15 mg via ORAL
  Filled 2014-03-10 (×2): qty 3
  Filled 2014-03-10: qty 2
  Filled 2014-03-10 (×2): qty 3
  Filled 2014-03-10: qty 2
  Filled 2014-03-10: qty 3
  Filled 2014-03-10: qty 1
  Filled 2014-03-10: qty 2
  Filled 2014-03-10: qty 1

## 2014-03-10 MED ORDER — POLYETHYLENE GLYCOL 3350 17 G PO PACK: 17.0000 g | PACK | Freq: Every day | ORAL | Status: DC | PRN

## 2014-03-10 MED ORDER — ALBUTEROL SULFATE (2.5 MG/3ML) 0.083% IN NEBU: 2.5000 mg | INHALATION_SOLUTION | RESPIRATORY_TRACT | Status: DC | PRN

## 2014-03-10 MED ORDER — LIDOCAINE HCL (CARDIAC) 20 MG/ML IV SOLN
INTRAVENOUS | Status: AC
  Filled 2014-03-10: qty 5

## 2014-03-10 MED ORDER — DOCUSATE SODIUM 100 MG PO CAPS
100.0000 mg | ORAL_CAPSULE | Freq: Two times a day (BID) | ORAL | Status: DC
  Administered 2014-03-10 – 2014-03-11 (×2): 100 mg via ORAL

## 2014-03-10 MED ORDER — CEFAZOLIN SODIUM-DEXTROSE 2-3 GM-% IV SOLR
2.0000 g | Freq: Four times a day (QID) | INTRAVENOUS | Status: AC
  Administered 2014-03-10 (×2): 2 g via INTRAVENOUS
  Filled 2014-03-10 (×2): qty 50

## 2014-03-10 SURGICAL SUPPLY — 45 items
BAG ZIPLOCK 12X15 (MISCELLANEOUS) IMPLANT
BENZOIN TINCTURE PRP APPL 2/3 (GAUZE/BANDAGES/DRESSINGS) IMPLANT
BLADE SAW SGTL 18X1.27X75 (BLADE) ×2 IMPLANT
BLADE SAW SGTL 18X1.27X75MM (BLADE) ×1
CAPT HIP PF COP ×3 IMPLANT
CELLS DAT CNTRL 66122 CELL SVR (MISCELLANEOUS) ×1 IMPLANT
CLOSURE WOUND 1/2 X4 (GAUZE/BANDAGES/DRESSINGS)
COVER PERINEAL POST (MISCELLANEOUS) ×3 IMPLANT
DRAPE C-ARM 42X120 X-RAY (DRAPES) ×3 IMPLANT
DRAPE STERI IOBAN 125X83 (DRAPES) ×3 IMPLANT
DRAPE U-SHAPE 47X51 STRL (DRAPES) ×9 IMPLANT
DRSG AQUACEL AG ADV 3.5X10 (GAUZE/BANDAGES/DRESSINGS) ×3 IMPLANT
DURAPREP 26ML APPLICATOR (WOUND CARE) ×3 IMPLANT
ELECT BLADE TIP CTD 4 INCH (ELECTRODE) ×3 IMPLANT
ELECT REM PT RETURN 9FT ADLT (ELECTROSURGICAL) ×3
ELECTRODE REM PT RTRN 9FT ADLT (ELECTROSURGICAL) ×1 IMPLANT
FACESHIELD WRAPAROUND (MASK) ×12 IMPLANT
GAUZE XEROFORM 1X8 LF (GAUZE/BANDAGES/DRESSINGS) ×3 IMPLANT
GLOVE BIO SURGEON STRL SZ7.5 (GLOVE) ×3 IMPLANT
GLOVE BIO SURGEON STRL SZ8 (GLOVE) ×3 IMPLANT
GLOVE BIOGEL PI IND STRL 7.5 (GLOVE) ×1 IMPLANT
GLOVE BIOGEL PI IND STRL 8 (GLOVE) ×2 IMPLANT
GLOVE BIOGEL PI IND STRL 8.5 (GLOVE) ×1 IMPLANT
GLOVE BIOGEL PI INDICATOR 7.5 (GLOVE) ×2
GLOVE BIOGEL PI INDICATOR 8 (GLOVE) ×4
GLOVE BIOGEL PI INDICATOR 8.5 (GLOVE) ×2
GLOVE ECLIPSE 8.0 STRL XLNG CF (GLOVE) ×3 IMPLANT
GLOVE SURG SS PI 7.5 STRL IVOR (GLOVE) ×3 IMPLANT
GOWN BRE IMP PREV XXLGXLNG (GOWN DISPOSABLE) ×3 IMPLANT
GOWN STRL REUS W/TWL XL LVL3 (GOWN DISPOSABLE) ×9 IMPLANT
HANDPIECE INTERPULSE COAX TIP (DISPOSABLE) ×2
KIT BASIN OR (CUSTOM PROCEDURE TRAY) ×3 IMPLANT
PACK TOTAL JOINT (CUSTOM PROCEDURE TRAY) ×3 IMPLANT
RTRCTR WOUND ALEXIS 18CM MED (MISCELLANEOUS) ×3
SET HNDPC FAN SPRY TIP SCT (DISPOSABLE) ×1 IMPLANT
STAPLER VISISTAT 35W (STAPLE) ×3 IMPLANT
STRIP CLOSURE SKIN 1/2X4 (GAUZE/BANDAGES/DRESSINGS) IMPLANT
SUT ETHIBOND NAB CT1 #1 30IN (SUTURE) ×3 IMPLANT
SUT MNCRL AB 4-0 PS2 18 (SUTURE) IMPLANT
SUT VIC AB 0 CT1 36 (SUTURE) ×3 IMPLANT
SUT VIC AB 1 CT1 36 (SUTURE) ×3 IMPLANT
SUT VIC AB 2-0 CT1 27 (SUTURE) ×4
SUT VIC AB 2-0 CT1 TAPERPNT 27 (SUTURE) ×2 IMPLANT
TOWEL OR 17X26 10 PK STRL BLUE (TOWEL DISPOSABLE) ×3 IMPLANT
TOWEL OR NON WOVEN STRL DISP B (DISPOSABLE) ×3 IMPLANT

## 2014-03-10 NOTE — Progress Notes (Signed)
Utilization review completed.  

## 2014-03-10 NOTE — Plan of Care (Signed)
Problem: Phase I Progression Outcomes Goal: CMS/Neurovascular status WDL Outcome: Completed/Met Date Met:  03/10/14     

## 2014-03-10 NOTE — Op Note (Signed)
Donald [name]:  Ahn, Corwyn               ACCOUNT NO.:  [id number]  MEDICAL RECORD NO.:  [id number]  LOCATION:  1601                         [facility]  PHYSICIAN:  Vanita PandaChristopher Y. 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  PROCEDURE:  Right total hip arthroplasty through direct anterior approach.  IMPLANTS:  DePuy Sector Gription acetabular component size 54 with apex hole eliminator and a single screw, size 36+ 4 neutral polyethylene liner for a size 54 acetabular component, size 12 Corail femoral component with varus offset (ALA), size 36+ 1.5 ceramic hip ball.  SURGEON:  Vanita PandaChristopher Y. Magnus IvanBlackman, MD  ASSISTANT:  Richardean CanalGilbert Clark, PA  ANESTHESIA:  General.  BLOOD LOSS:  150 mL.  ANTIBIOTICS:  3 g IV Ancef.  COMPLICATIONS:  None.  INDICATIONS:  [name] is a 58 year old gentleman well known to me. He has about a 3 year history of worsening right hip pain especially over the last year.  He has psoriasis and likely psoriatic arthritis versus primary osteoarthritis.  He has x-rays that show complete loss of his joint space on his right hip with significant sclerotic changes, periarticular osteophytes, and even some cystic changes.  His pain is daily, his mobility is limited, and his activities of daily living are impacted.  He says his quality of life is now impacted.  At this point, he wished to proceed with a total hip arthroplasty.  He understands the risks of acute blood loss anemia, nerve and vessel injury, fracture, infection, DVT, dislocation.  He understands the goals are to decrease pain, improve mobility, and overall improve quality of life.  PROCEDURE DESCRIPTION:  After informed consent was  obtained, appropriate right hip was marked, he was brought to the operating room and general anesthesia was obtained while he was on a stretcher.  Traction boots were placed on both of his feet.  Next, he was placed supine on the Hana fracture table with a perineal post in place and both legs in inline skeletal traction devices but no traction applied.  His right operative hip was then prepped and draped with DuraPrep and sterile drapes.  A time-out was called to identify correct patient, correct right hip.  We then made an incision inferior and posterior to the anterior-superior iliac spine and carried this obliquely down the leg.  I dissected down to the tensor fascia lata muscle, and then the tensor fascia was divided longitudinally so we could proceed with a direct anterior approach to the hip.  We identified and cauterized the lateral femoral circumflex vessels and then placed a Cobra retractor around the lateral neck and up underneath the rectus femoris a Cobra retractor medially.  I then opened up the hip capsule in an L-type format finding a very large joint effusion and significant synovitis suggesting an inflammatory process such as psoriatic arthritis.  We then placed the Cobra retractors within the hip capsule and made our femoral neck cut with an oscillating saw just proximal to the  lesser trochanter and completed this with an osteotome.  I placed a corkscrew guide in the femoral head and removed the femoral head in its entirety and found it to be completely devoid of cartilage.  I then cleaned the acetabular debris and remnants of acetabular labrum.  I placed a bent Hohmann medially and a Cobra retractor laterally and then began reaming in 2 mm increments from a size 42 all the way up to a size 54 with all reamers under direct visualization and the last reamer also under direct fluoroscopy so we could obtain our depth of reaming, our inclination, and anteversion.  I then  placed the real DePuy Sector Gription acetabular component size 54 with apex hole eliminator guide and single screw.  Attention was then turned to the femur.  With the leg externally rotated to 100 degrees and extended and adducted we were able to place a Mueller retractor medially and a Hohmann retractor behind the greater trochanter.  I released the lateral joint capsule and the piriformis and used a box cutting osteotome to enter the femoral canal and a rongeur to lateralize and then began broaching from a size 8 broached up to a size 12.  With the size 12 we trialed a standard neck and a 36+ 1.5 hip ball.  We brought the leg back up in open traction and internal rotation reduced in the pelvis and felt like it was slightly long in height but otherwise was stable.  We then dislocated the hip and removed the trial components. At that point, I lateralize a little bit more and then decided to place a real size 12 stem but with varus offset decrease his length and improve his offset.  We placed the real Corail femoral component size 12 with a varus offset into the femoral canal nice and tightly, so then we went with a real 36+ 1.5 ceramic hip ball.  We reduced this in the pelvis and he was stable on rotation.  His leg lengths and offset were measured near equal under direct fluoroscopy.  I then copiously irrigated the soft tissues with normal saline solution using pulsatile lavage, closed the joint capsule with interrupted #1 Ethibond suture followed by a running #1 Vicryl in the tensor fascia, 0 Vicryl in the deep tissue, 2-0 Vicryl in subcutaneous tissue, staples on the skin, and Aquacel dressing was applied.  He was then taken off the Hana table, awakened, extubated, and taken to recovery room in stable condition. All final counts were correct.  There were no complications noted.  Of note, Rexene EdisonGil Clark, PA-C assisted during the entire case and his assistance was crucial for facilitating all  aspects of this case.     Vanita Pandahristopher Y. Magnus IvanBlackman, M.D.     CYB/MEDQ  D:  03/10/2014  T:  03/10/2014  Job:  [id number]

## 2014-03-10 NOTE — Plan of Care (Signed)
Problem: Phase I Progression Outcomes Goal: Other Phase I Outcomes/Goals Outcome: Not Applicable Date Met:  40/69/86

## 2014-03-10 NOTE — Evaluation (Signed)
Physical Therapy Evaluation Patient Details Name: Donald SamsDwight E [name] MRN: [id number] DOB: Jul 30, 1955 Today's Date: 03/10/2014   History of Present Illness  R THR  Clinical Impression  Pt s/p R THR presents with decreased R LE strength/ROM and post op pain limiting functional mobility.  Pt should progress to d/c home with family assist and HHPT follow up.    Follow Up Recommendations Home health PT    Equipment Recommendations  None recommended by PT    Recommendations for Other Services OT consult     Precautions / Restrictions Precautions Precautions: Fall Restrictions Weight Bearing Restrictions: No Other Position/Activity Restrictions: WBAT      Mobility  Bed Mobility Overal bed mobility: Needs Assistance Bed Mobility: Supine to Sit     Supine to sit: Min assist;Mod assist     General bed mobility comments: cues for sequence and use of L LE to self assist  Transfers Overall transfer level: Needs assistance Equipment used: Rolling walker (2 wheeled) Transfers: Sit to/from Stand Sit to Stand: Mod assist         General transfer comment: cues for LE management and use of UEs to self assist  Ambulation/Gait Ambulation/Gait assistance: Min assist;Mod assist Ambulation Distance (Feet): 34 Feet Assistive device: Rolling walker (2 wheeled) Gait Pattern/deviations: Step-to pattern;Decreased step length - right;Decreased step length - left;Shuffle;Trunk flexed Gait velocity: decr   General Gait Details: cues for sequence, posture and position from [employer]  Stairs            Wheelchair Mobility    Modified Rankin (Stroke Patients Only)       Balance                                             Pertinent Vitals/Pain Pain Assessment: 0-10 Pain Score: 6  Pain Location: R hip Pain Descriptors / Indicators: Aching;Sore Pain Intervention(s): Limited activity within patient's tolerance;Monitored during session;Premedicated before  session;Ice applied    Home Living Family/patient expects to be discharged to:: Private residence Living Arrangements: Spouse/significant other Available Help at Discharge: Family Type of Home: House Home Access: Stairs to enter Entrance Stairs-Rails: Right;Left;Can reach both Secretary/administratorntrance Stairs-Number of Steps: 4 Home Layout: Two level Home Equipment: Environmental consultantWalker - 2 wheels      Prior Function Level of Independence: Independent               Hand Dominance   Dominant Hand: Right    Extremity/Trunk Assessment   Upper Extremity Assessment: Overall WFL for tasks assessed           Lower Extremity Assessment: RLE deficits/detail RLE Deficits / Details: 2+/5 hip strength with AAROM at hip to 85 flex and 15 abd    Cervical / Trunk Assessment: Normal  Communication   Communication: No difficulties  Cognition Arousal/Alertness: Awake/alert Behavior During Therapy: WFL for tasks assessed/performed Overall Cognitive Status: Within Functional Limits for tasks assessed                      General Comments      Exercises Total Joint Exercises Ankle Circles/Pumps: AROM;Both;15 reps;Supine Quad Sets: AROM;Both;10 reps;Supine Heel Slides: AAROM;Right;15 reps;Supine Hip ABduction/ADduction: AAROM;Right;10 reps;Supine      Assessment/Plan    PT Assessment Patient needs continued PT services  PT Diagnosis Difficulty walking   PT Problem List Decreased strength;Decreased range of motion;Decreased activity tolerance;Decreased mobility;Decreased knowledge of  use of DME;Obesity;Pain  PT Treatment Interventions DME instruction;Gait training;Stair training;Functional mobility training;Therapeutic activities;Therapeutic exercise;Patient/family education   PT Goals (Current goals can be found in the Care Plan section) Acute Rehab PT Goals Patient Stated Goal: Train on my new elyptical PT Goal Formulation: With patient Time For Goal Achievement: 03/17/14 Potential to  Achieve Goals: Good    Frequency 7X/week   Barriers to discharge        Co-evaluation               End of Session Equipment Utilized During Treatment: Gait belt Activity Tolerance: Patient tolerated treatment well Patient left: in chair;with call bell/phone within reach;with family/visitor present Nurse Communication: Mobility status         Time: [id number] PT Time Calculation (min) (ACUTE ONLY): 35 min   Charges:   PT Evaluation $Initial PT Evaluation Tier I: 1 Procedure PT Treatments $Gait Training: 8-22 mins $Therapeutic Exercise: 8-22 mins   PT G Codes:          Kalia Vahey 03/10/2014, 4:39 PM

## 2014-03-10 NOTE — H&P (Signed)
TOTAL HIP ADMISSION H&P  Patient is admitted for right total hip arthroplasty.  Subjective:  Chief Complaint: right hip pain  HPI: Donald Donald [name], 58 y.o. male, has a history of pain and functional disability in the right hip(s) due to primary osteo- arthritis and patient has failed non-surgical conservative treatments for greater than 12 weeks to include NSAID's and/or analgesics, corticosteriod injections, flexibility and strengthening excercises, use of assistive devices, weight reduction as appropriate and activity modification.  Onset of symptoms was gradual starting 3 years ago with gradually worsening course since that time.The patient noted no past surgery on the right hip(s).  Patient currently rates pain in the right hip at 10 out of 10 with activity. Patient has night pain, worsening of pain with activity and weight bearing, pain that interfers with activities of daily living and pain with passive range of motion. Patient has evidence of subchondral sclerosis, periarticular osteophytes and joint space narrowing by imaging studies. This condition presents safety issues increasing the risk of falls.  There is no current active infection.  Patient Active Problem List   Diagnosis Date Noted  . Primary osteoarthritis of right hip 03/10/2014  . Diastasis recti 02/21/2013   Past Medical History  Diagnosis Date  . Hyperlipidemia   . Hypertension   . Asthma   . Sleep apnea     cpap settings ? 10     Past Surgical History  Procedure Laterality Date  . Sinus exploration    . Ureteroscopy      multiple times   . Hernia repair      x 2     No prescriptions prior to admission   Allergies  Allergen Reactions  . Ibuprofen Swelling    Ankle and lower leg.(when took over long period of time)  . Prednisone Swelling  . Tetracyclines & Related Nausea Only    History  Substance Use Topics  . Smoking status: Former Smoker    Types: Cigarettes  . Smokeless tobacco: Never Used   Comment: patient quit smoking around 25  . Alcohol Use: 21.0 oz/week    21 Cans of beer, 14 Shots of liquor per week    No family history on file.   Review of Systems  Musculoskeletal: Positive for joint pain.  All other systems reviewed and are negative.   Objective:  Physical Exam  Constitutional: He is oriented to person, place, and time. He appears well-developed and well-nourished.  HENT:  Head: Normocephalic and atraumatic.  Eyes: EOM are normal. Pupils are equal, round, and reactive to light.  Neck: Normal range of motion. Neck supple.  Cardiovascular: Normal rate and regular rhythm.   Respiratory: Effort normal and breath sounds normal.  GI: Soft. Bowel sounds are normal.  Musculoskeletal:       Right hip: He exhibits decreased range of motion, decreased strength, tenderness and bony tenderness.  Neurological: He is alert and oriented to person, place, and time.  Skin: Skin is warm and dry.  Psychiatric: He has a normal mood and affect.    Vital signs in last 24 hours:    Labs:   Estimated body mass index is 40.59 kg/(m^2) as calculated from the following:   Height as of 11/29/13: 5\' 9"  (1.753 m).   Weight as of 11/29/13: 124.739 kg (275 lb).   Imaging Review Plain radiographs demonstrate severe degenerative joint disease of the right hip(s). The bone quality appears to be good for age and reported activity level.  Assessment/Plan:  End stage arthritis,  right hip(s)  The patient history, physical examination, clinical judgement of the provider and imaging studies are consistent with end stage degenerative joint disease of the right hip(s) and total hip arthroplasty is deemed medically necessary. The treatment options including medical management, injection therapy, arthroscopy and arthroplasty were discussed at length. The risks and benefits of total hip arthroplasty were presented and reviewed. The risks due to aseptic loosening, infection, stiffness,  dislocation/subluxation,  thromboembolic complications and other imponderables were discussed.  The patient acknowledged the explanation, agreed to proceed with the plan and consent was signed. Patient is being admitted for inpatient treatment for surgery, pain control, PT, OT, prophylactic antibiotics, VTE prophylaxis, progressive ambulation and ADL's and discharge planning.The patient is planning to be discharged home with home health services

## 2014-03-10 NOTE — Brief Op Note (Signed)
03/10/2014  11:31 AM  PATIENT:  Donald [name]  58 y.o. male  PRE-OPERATIVE DIAGNOSIS:  Right hip osteoarthritis  POST-OPERATIVE DIAGNOSIS:  Right hip osteoarthritis  PROCEDURE:  Procedure(s): RIGHT TOTAL HIP ARTHROPLASTY ANTERIOR APPROACH (Right)  SURGEON:  Surgeon(s) and Role:    * Kathryne Hitchhristopher Y Blackman, MD - Primary  PHYSICIAN ASSISTANT: Rexene EdisonGil Clark, PA-C  ANESTHESIA:   general  EBL:  Total I/O In: 1000 [I.V.:1000] Out: 850 [Blood:850]  BLOOD ADMINISTERED:none  DRAINS: none   LOCAL MEDICATIONS USED:  NONE  SPECIMEN:  No Specimen  DISPOSITION OF SPECIMEN:  N/A  COUNTS:  YES  TOURNIQUET:  * No tourniquets in log *  DICTATION: .Other Dictation: Dictation Number [id number]  PLAN OF CARE: Admit to inpatient   PATIENT DISPOSITION:  PACU - hemodynamically stable.   Delay start of Pharmacological VTE agent (>24hrs) due to surgical blood loss or risk of bleeding: no

## 2014-03-10 NOTE — Plan of Care (Signed)
Problem: Phase I Progression Outcomes Goal: Initial discharge plan identified Outcome: Completed/Met Date Met:  03/10/14

## 2014-03-10 NOTE — Plan of Care (Signed)
Problem: Phase I Progression Outcomes Goal: Pain controlled with appropriate interventions Outcome: Completed/Met Date Met:  03/10/14     

## 2014-03-10 NOTE — Anesthesia Postprocedure Evaluation (Signed)
  Anesthesia Post-op Note  Patient: Donald [name]  Procedure(s) Performed: Procedure(s) (LRB): RIGHT TOTAL HIP ARTHROPLASTY ANTERIOR APPROACH (Right)  Patient Location: PACU  Anesthesia Type: General  Level of Consciousness: awake and alert   Airway and Oxygen Therapy: Patient Spontanous Breathing  Post-op Pain: mild  Post-op Assessment: Post-op Vital signs reviewed, Patient's Cardiovascular Status Stable, Respiratory Function Stable, Patent Airway and No signs of Nausea or vomiting  Last Vitals:  Filed Vitals:   03/10/14 1615  BP: 135/80  Pulse: 91  Temp: 36.9 C  Resp: 16    Post-op Vital Signs: stable   Complications: No apparent anesthesia complications\

## 2014-03-10 NOTE — Plan of Care (Signed)
Problem: Phase I Progression Outcomes Goal: Hemodynamically stable Outcome: Completed/Met Date Met:  03/10/14

## 2014-03-10 NOTE — Plan of Care (Signed)
Problem: Phase I Progression Outcomes Goal: Dangle or out of bed evening of surgery Outcome: Completed/Met Date Met:  03/10/14     

## 2014-03-10 NOTE — Transfer of Care (Signed)
Immediate Anesthesia Transfer of Care Note  Patient: Donald [name]  Procedure(s) Performed: Procedure(s): RIGHT TOTAL HIP ARTHROPLASTY ANTERIOR APPROACH (Right)  Patient Location: PACU  Anesthesia Type:General  Level of Consciousness: awake, alert  and oriented  Airway & Oxygen Therapy: Patient Spontanous Breathing and Patient connected to face mask oxygen  Post-op Assessment: Report given to PACU RN and Post -op Vital signs reviewed and stable  Post vital signs: Reviewed and stable  Complications: No apparent anesthesia complications

## 2014-03-11 LAB — CBC
HCT: 35.4 % — ABNORMAL LOW (ref 39.0–52.0)
HEMOGLOBIN: 11.7 g/dL — AB (ref 13.0–17.0)
MCH: 31.8 pg (ref 26.0–34.0)
MCHC: 33.1 g/dL (ref 30.0–36.0)
MCV: 96.2 fL (ref 78.0–100.0)
PLATELETS: 187 10*3/uL (ref 150–400)
RBC: 3.68 MIL/uL — ABNORMAL LOW (ref 4.22–5.81)
RDW: 12.7 % (ref 11.5–15.5)
WBC: 10.4 10*3/uL (ref 4.0–10.5)

## 2014-03-11 LAB — BASIC METABOLIC PANEL
ANION GAP: 10 (ref 5–15)
BUN: 13 mg/dL (ref 6–23)
CALCIUM: 9.8 mg/dL (ref 8.4–10.5)
CHLORIDE: 94 meq/L — AB (ref 96–112)
CO2: 27 meq/L (ref 19–32)
Creatinine, Ser: 0.95 mg/dL (ref 0.50–1.35)
GFR calc Af Amer: >90 mL/min (ref >90)
GFR calc non Af Amer: >90 mL/min (ref >90)
Glucose, Bld: 122 mg/dL — ABNORMAL HIGH (ref 70–99)
POTASSIUM: 3.9 meq/L (ref 3.7–5.3)
SODIUM: 131 meq/L — AB (ref 137–147)

## 2014-03-11 MED ORDER — OXYCODONE-ACETAMINOPHEN 5-325 MG PO TABS: 1.0000 | ORAL_TABLET | ORAL | Status: DC | PRN

## 2014-03-11 MED ORDER — METHOCARBAMOL 500 MG PO TABS: 500.0000 mg | ORAL_TABLET | Freq: Four times a day (QID) | ORAL | Status: DC | PRN

## 2014-03-11 MED ORDER — RIVAROXABAN 10 MG PO TABS: 10.0000 mg | ORAL_TABLET | Freq: Every day | ORAL | Status: DC

## 2014-03-11 NOTE — Discharge Instructions (Signed)
Information on my medicine - XARELTO (Rivaroxaban)  This medication education was reviewed with me or my healthcare representative as part of my discharge preparation.  The pharmacist that spoke with me during my hospital stay was:  Jamse MeadGadhia, Yandiel Bergum M, [facility]  Why was Xarelto prescribed for you? Xarelto was prescribed for you to reduce the risk of blood clots forming after orthopedic surgery. The medical term for these abnormal blood clots is venous thromboembolism (VTE).  What do you need to know about xarelto ? Take your Xarelto ONCE DAILY at the same time every day. You may take it either with or without food.  If you have difficulty swallowing the tablet whole, you may crush it and mix in applesauce just prior to taking your dose.  Take Xarelto exactly as prescribed by your doctor and DO NOT stop taking Xarelto without talking to the doctor who prescribed the medication.  Stopping without other VTE prevention medication to take the place of Xarelto may increase your risk of developing a clot.  After discharge, you should have regular check-up appointments with your healthcare provider that is prescribing your Xarelto.    What do you do if you miss a dose? If you miss a dose, take it as soon as you remember on the same day then continue your regularly scheduled once daily regimen the next day. Do not take two doses of Xarelto on the same day.   Important Safety Information A possible side effect of Xarelto is bleeding. You should call your healthcare provider right away if you experience any of the following: ? Bleeding from an injury or your nose that does not stop. ? Unusual colored urine (red or dark brown) or unusual colored stools (red or black). ? Unusual bruising for unknown reasons. ? A serious fall or if you hit your head (even if there is no bleeding).  Some medicines may interact with Xarelto and might increase your risk of bleeding while on Xarelto. To help avoid  this, consult your healthcare provider or pharmacist prior to using any new prescription or non-prescription medications, including herbals, vitamins, non-steroidal anti-inflammatory drugs (NSAIDs) and supplements.  DO NOT TAKE IBUPROFEN WHILE ON XARELTO, AS THIS CAN INCREASE RISK OF BLEEDING.  This website has more information on Xarelto: [email or web address].[email or web address].

## 2014-03-11 NOTE — Progress Notes (Signed)
Physical Therapy Treatment Patient Details Name: Donald SamsDwight E Dinapoli MRN: [id number] DOB: 1955-11-15 Today's Date: 03/11/2014    History of Present Illness R THR    PT Comments    Pt progressing well with mobility.  Reviewed stairs and car transfers with pt and spouse.  Follow Up Recommendations  Home health PT     Equipment Recommendations  None recommended by PT    Recommendations for Other Services OT consult     Precautions / Restrictions Precautions Precautions: Fall Restrictions Weight Bearing Restrictions: No Other Position/Activity Restrictions: WBAT    Mobility  Bed Mobility               General bed mobility comments: OOB with OT  Transfers Overall transfer level: Needs assistance Equipment used: Rolling walker (2 wheeled) Transfers: Sit to/from Stand Sit to Stand: Min guard;Supervision         General transfer comment: verbal cues hand placement and LE management.  Ambulation/Gait Ambulation/Gait assistance: Min guard;Supervision Ambulation Distance (Feet): 70 Feet Assistive device: Rolling walker (2 wheeled) Gait Pattern/deviations: Step-to pattern;Decreased step length - right;Decreased step length - left;Shuffle;Trunk flexed Gait velocity: decr   General Gait Details: cues for sequence, posture and position from RW   Stairs Stairs: Yes Stairs assistance: Min assist Stair Management: One rail Right;Forwards;With crutches;Step to pattern Number of Stairs: 2 General stair comments: cues for sequence and foot/crutch placement - written instructions provided  Wheelchair Mobility    Modified Rankin (Stroke Patients Only)       Balance                                    Cognition Arousal/Alertness: Awake/alert Behavior During Therapy: WFL for tasks assessed/performed Overall Cognitive Status: Within Functional Limits for tasks assessed                      Exercises Total Joint Exercises Ankle  Circles/Pumps: AROM;Both;15 reps;Supine Quad Sets: AROM;Both;10 reps;Supine Heel Slides: AAROM;Right;Supine;20 reps Hip ABduction/ADduction: AAROM;Right;Supine;15 reps    General Comments        Pertinent Vitals/Pain Pain Assessment: 0-10 Pain Score: 3  Pain Location: R hip Pain Descriptors / Indicators: Aching;Burning Pain Intervention(s): Limited activity within patient's tolerance;Monitored during session;Premedicated before session;Ice applied    Home Living                      Prior Function            PT Goals (current goals can now be found in the care plan section) Acute Rehab PT Goals Patient Stated Goal: none stated.  PT Goal Formulation: With patient Time For Goal Achievement: 03/17/14 Potential to Achieve Goals: Good Progress towards PT goals: Progressing toward goals    Frequency  7X/week    PT Plan Current plan remains appropriate    Co-evaluation             End of Session Equipment Utilized During Treatment: Gait belt Activity Tolerance: Patient tolerated treatment well Patient left: in chair;with call bell/phone within reach;with family/visitor present     Time: 1323-1350 PT Time Calculation (min) (ACUTE ONLY): 27 min  Charges:  $Gait Training: 8-22 mins $Therapeutic Exercise: 8-22 mins $Therapeutic Activity: 8-22 mins                    G Codes:      Erling Arrazola 03/11/2014, 2:17 PM

## 2014-03-11 NOTE — Evaluation (Signed)
Occupational Therapy Evaluation Patient Details Name: Donald [name] MRN: [id number] DOB: [birth year] Today's Date: 03/11/2014    History of Present Illness R THR   Clinical Impression   Pt at 4 for pain at rest and 6 with activity. Had just recently had pain meds. Pt with some lightheadedness with up to the bathroom this am with OT but resolved by the time he transferred into recliner. Will follow for acute OT. Wife will assist with LB self care.     Follow Up Recommendations  Home health OT;Supervision/Assistance - 24 hour    Equipment Recommendations  3 in 1 bedside comode    Recommendations for Other Services       Precautions / Restrictions Precautions Precautions: Fall Restrictions Weight Bearing Restrictions: No Other Position/Activity Restrictions: WBAT      Mobility Bed Mobility Overal bed mobility: Needs Assistance Bed Mobility: Supine to Sit     Supine to sit: Min assist;HOB elevated     General bed mobility comments: assist for R LE over to EOB. Cues to use L LE to self assist. use of rails.   Transfers Overall transfer level: Needs assistance Equipment used: Rolling walker (2 wheeled) Transfers: Sit to/from Stand Sit to Stand: Min assist         General transfer comment: verbal cues hand placement and LE management.    Balance                                            ADL Overall ADL's : Needs assistance/impaired Eating/Feeding: Independent;Sitting   Grooming: Wash/dry hands;Set up;Sitting   Upper Body Bathing: Set up;Sitting   Lower Body Bathing: Moderate assistance;Sit to/from stand   Upper Body Dressing : Set up;Sitting   Lower Body Dressing: Moderate assistance;Sit to/from stand   Toilet Transfer: Minimal assistance;Ambulation;BSC;RW   Toileting- Clothing Manipulation and Hygiene: Minimal assistance;Sit to/from stand         General ADL Comments: Pt has a tub with a seat that can be placed in. Discussed  that he would benefit from additional few days at least to sponge bathe and let HH assess tub transfers as pt still weightbearing through UEs heavily and no grab bars in tub. Pt states wife can assist with LB self care and he is not interested in AE at this time. Discussed sequence for LB dressing and safety.      Vision                     Perception     Praxis      Pertinent Vitals/Pain Pain Assessment: 0-10 Pain Score: 6  Pain Location: R hip. with activity Pain Descriptors / Indicators: Aching Pain Intervention(s): Repositioned;Ice applied     Hand Dominance Right   Extremity/Trunk Assessment Upper Extremity Assessment Upper Extremity Assessment: Overall WFL for tasks assessed           Communication Communication Communication: No difficulties   Cognition Arousal/Alertness: Awake/alert Behavior During Therapy: WFL for tasks assessed/performed Overall Cognitive Status: Within Functional Limits for tasks assessed                     General Comments       Exercises       Shoulder Instructions      Home Living Family/patient expects to be discharged to:: Private residence Living Arrangements: Spouse/significant other Available  Help at Discharge: Family Type of Home: House Home Access: Stairs to enter Secretary/administratorntrance Stairs-Number of Steps: 4 Entrance Stairs-Rails: Right;Left;Can reach both Home Layout: Two level Alternate Level Stairs-Number of Steps: 16 Alternate Level Stairs-Rails: Right Bathroom Shower/Tub: Tub/shower unit   Bathroom Toilet: Handicapped height     Home Equipment: Environmental consultantWalker - 2 wheels          Prior Functioning/Environment Level of Independence: Independent             OT Diagnosis: Generalized weakness   OT Problem List: Decreased strength;Decreased knowledge of use of DME or AE   OT Treatment/Interventions: Self-care/ADL training;Patient/family education;Therapeutic activities;DME and/or AE instruction    OT  Goals(Current goals can be found in the care plan section) Acute Rehab OT Goals Patient Stated Goal: none stated.  OT Goal Formulation: With patient Time For Goal Achievement: 03/18/14 Potential to Achieve Goals: Good  OT Frequency: Min 2X/week   Barriers to D/C:            Co-evaluation              End of Session Equipment Utilized During Treatment: Gait belt;Rolling walker  Activity Tolerance: Other (comment) (some lightheadedness with activity.) Patient left: in chair;with call bell/phone within reach   Time: 1610-96040849-0923 OT Time Calculation (min): 34 min Charges:  OT General Charges $OT Visit: 1 Procedure OT Evaluation $Initial OT Evaluation Tier I: 1 Procedure OT Treatments $Self Care/Home Management : 8-22 mins $Therapeutic Activity: 8-22 mins G-Codes:    Lennox LaityStone, Jentry Mcqueary [place]  [phone] 03/11/2014, 9:32 AM

## 2014-03-11 NOTE — Care Management Note (Signed)
    Page 1 of 2   03/11/2014     12:56:16 PM CARE MANAGEMENT NOTE 03/11/2014  Patient:  Donald [name],Donald [name]   Account Number:  [id number]  Date Initiated:  03/11/2014  Documentation initiated by:  Lanier ClamMAHABIR,Contrell Ballentine  Subjective/Objective Assessment:   58 Y/O M ADMITTED W/R HIP OSTEO     Action/Plan:   FROM HOME.   Anticipated DC Date:  03/11/2014   Anticipated DC Plan:  HOME W HOME HEALTH SERVICES      DC Planning Services  CM consult      Choice offered to / List presented to:  C-1 Patient   DME arranged  WALKER - ROLLING  BEDSIDE COMMODE      DME agency  [facility]     HH arranged  HH-2 PT  HH-3 OT      [facility]   Status of service:  Completed, signed off Medicare Important Message given?   (If response is "NO", the following Medicare IM given date fields will be blank) Date Medicare IM given:   Medicare IM given by:   Date Additional Medicare IM given:   Additional Medicare IM given by:    Discharge Disposition:  HOME W HOME HEALTH SERVICES  Per UR Regulation:  Reviewed for med. necessity/level of care/duration of stay  If discussed at Long Length of Stay Meetings, dates discussed:    Comments:  03/11/14 Belkis Norbeck RN,BSN NCM [phone] [facility] REP URSULA ALREADY FOLOWING FROM PCP OFFICE,AWARE OF D/C & HHC ORDERS.HHPT/OT ORDERED.HME RW,3N1-TC [facility] DME REP JAMES AWARE OF ORDERS,& WILL DELIVER DME TO RM PRIOR TO D/C.NO FURTHER D/C NEEDS.

## 2014-03-11 NOTE — Discharge Summary (Signed)
Patient ID: Donald [name] MRN: [id number] DOB/AGE: 10/17/1955 58 y.o.  Admit date: 03/10/2014 Discharge date: 03/11/2014  Admission Diagnoses:  Principal Problem:   Primary osteoarthritis of right hip Active Problems:   Status post total replacement of right hip   Discharge Diagnoses:  Same  Past Medical History  Diagnosis Date  . Hyperlipidemia   . Hypertension   . Asthma   . Sleep apnea     cpap settings ? 10     Surgeries: Procedure(s): RIGHT TOTAL HIP ARTHROPLASTY ANTERIOR APPROACH on 03/10/2014   Consultants:    Discharged Condition: Improved  Hospital Course: Donald [name] is an 58 y.o. male who was admitted 03/10/2014 for operative treatment ofArthritis of right hip. Patient has severe unremitting pain that affects sleep, daily activities, and work/hobbies. After pre-op clearance the patient was taken to the operating room on 03/10/2014 and underwent  Procedure(s): RIGHT TOTAL HIP ARTHROPLASTY ANTERIOR APPROACH.    Patient was given perioperative antibiotics: Anti-infectives    Start     Dose/Rate Route Frequency Ordered Stop   03/10/14 1600  ceFAZolin (ANCEF) IVPB 2 g/50 mL premix     2 g100 mL/hr over 30 Minutes Intravenous Every 6 hours 03/10/14 1325 03/10/14 2155   03/10/14 0600  ceFAZolin (ANCEF) 3 g in dextrose 5 % 50 mL IVPB     3 g160 mL/hr over 30 Minutes Intravenous On call to O.R. 03/09/14 1318 03/10/14 1015       Patient was given sequential compression devices, early ambulation, and chemoprophylaxis to prevent DVT.  Patient benefited maximally from hospital stay and there were no complications.    Recent vital signs: Patient Vitals for the past 24 hrs:  BP Temp Temp src Pulse Resp SpO2 Height Weight  03/11/14 1017 - (!) 101.3 F (38.5 C) - - - - - -  03/11/14 0956 126/78 mmHg (!) 101.3 F (38.5 C) Oral 91 16 99 % - -  03/11/14 0539 (!) 156/82 mmHg (!) 100.5 F (38.1 C) Oral 79 17 99 % - -  03/11/14 0359 - - - - 16 97 % - -  03/11/14  0042 138/88 mmHg (!) 101.2 F (38.4 C) Oral 88 17 97 % - -  03/10/14 2109 131/88 mmHg 100.2 F (37.9 C) Oral 76 16 98 % - -  03/10/14 2000 - - - - 16 98 % - -  03/10/14 1615 135/80 mmHg 98.4 F (36.9 C) Oral 91 16 95 % - -  03/10/14 1515 136/75 mmHg 98.4 F (36.9 C) Oral 91 16 95 % - -  03/10/14 1415 133/88 mmHg 98.8 F (37.1 C) Oral 83 16 98 % - -  03/10/14 1313 131/81 mmHg 98.4 F (36.9 C) Oral 89 16 94 % 5\' 7"  (1.702 m) 125.193 kg (276 lb)  03/10/14 1300 135/80 mmHg 98.2 F (36.8 C) - 83 19 100 % - -  03/10/14 1245 120/81 mmHg - - 78 (!) 9 100 % - -  03/10/14 1230 (!) 147/86 mmHg - - 83 12 100 % - -  03/10/14 1215 (!) 150/87 mmHg - - 87 13 100 % - -  03/10/14 1200 (!) 150/87 mmHg - - 100 11 99 % - -  03/10/14 1158 (!) 156/87 mmHg 98.1 F (36.7 C) - 95 12 100 % - -     Recent laboratory studies:  Recent Labs  03/11/14 0524  WBC 10.4  HGB 11.7*  HCT 35.4*  PLT 187  NA 131*  K 3.9  CL  94*  CO2 27  BUN 13  CREATININE 0.95  GLUCOSE 122*  CALCIUM 9.8     Discharge Medications:     Medication List    STOP taking these medications        HYDROcodone-acetaminophen 5-325 MG per tablet  Commonly known as:  NORCO/VICODIN      TAKE these medications        albuterol 108 (90 BASE) MCG/ACT inhaler  Commonly known as:  PROVENTIL HFA;VENTOLIN HFA  Inhale 1-2 puffs into the lungs every 6 (six) hours as needed for wheezing or shortness of breath.     PROAIR HFA 108 (90 BASE) MCG/ACT inhaler  Generic drug:  albuterol  Inhale 1-2 puffs into the lungs every 4 (four) hours as needed for wheezing or shortness of breath.     amLODipine 10 MG tablet  Commonly known as:  NORVASC  Take 10 mg by mouth every morning.     calcipotriene-betamethasone external suspension  Commonly known as:  TACLONEX SCALP  Apply 1 application topically daily as needed (psoriasis).     glucosamine-chondroitin 500-400 MG tablet  Take 2 tablets by mouth every morning.     hydrochlorothiazide  25 MG tablet  Commonly known as:  HYDRODIURIL  Take 25 mg by mouth every morning.     ibuprofen 200 MG tablet  Commonly known as:  ADVIL,MOTRIN  Take 200 mg by mouth every 6 (six) hours as needed for pain.     lisinopril 40 MG tablet  Commonly known as:  PRINIVIL,ZESTRIL  Take 40 mg by mouth every morning.     methocarbamol 500 MG tablet  Commonly known as:  ROBAXIN  Take 1 tablet (500 mg total) by mouth every 6 (six) hours as needed for muscle spasms.     multivitamin with minerals Tabs tablet  Take 1 tablet by mouth every morning.     oxyCODONE-acetaminophen 5-325 MG per tablet  Commonly known as:  ROXICET  Take 1-2 tablets by mouth every 4 (four) hours as needed.     POTASSIUM PO  Take 1 tablet by mouth daily as needed (cramping.).     rivaroxaban 10 MG Tabs tablet  Commonly known as:  XARELTO  Take 1 tablet (10 mg total) by mouth daily with breakfast.     sodium chloride 0.65 % Soln nasal spray  Commonly known as:  OCEAN  Place 2 sprays into both nostrils daily as needed for congestion.     VECTICAL 3 MCG/GM cream  Generic drug:  Calcitriol  Apply 1 application topically daily as needed (psoriasis).        Diagnostic Studies: Dg Chest 2 View  03/02/2014   CLINICAL DATA:  Preop.  Hypertension.  EXAM: CHEST  2 VIEW  COMPARISON:  None.  FINDINGS: No cardiomegaly when accounting for low lung volumes. There is moderate aortic tortuosity correlating with history of chronic hypertension. Mild interstitial crowding at the bases. Discrete atelectasis or scar in the lingula which is minimal. There is no edema, consolidation, effusion, or pneumothorax.  IMPRESSION: No active cardiopulmonary disease.   Electronically Signed   By: Tiburcio PeaJonathan  Watts M.D.   On: 03/02/2014 17:09   Dg Hip Complete Right  03/10/2014   CLINICAL DATA:  Intra op right hip replacement  EXAM: RIGHT HIP - COMPLETE 2+ VIEW  COMPARISON:  None.  FLUOROSCOPY TIME:  24 seconds  FINDINGS: Intraoperative  fluoroscopic spot images were utilized during right hip arthroplasty and have been submitted for interpretation.  Right hip arthroplasty in  satisfactory position.  IMPRESSION: Right hip arthroplasty in satisfactory position.   Electronically Signed   By: Charline BillsSriyesh  Krishnan M.D.   On: 03/10/2014 13:12   Dg Pelvis Portable  03/10/2014   CLINICAL DATA:  RIGHT hip replacement  EXAM: PORTABLE PELVIS 1-2 VIEWS  COMPARISON:  Portable exam 1205 hr without priors for comparison  FINDINGS: Acetabular and femoral components of a RIGHT hip prosthesis are identified.  No acute fracture or dislocation.  LEFT hip joint space unremarkable.  No acute osseous abnormality seen.  IMPRESSION: RIGHT hip prosthesis without acute complication.   Electronically Signed   By: Ulyses SouthwardMark  Boles M.D.   On: 03/10/2014 12:19   Dg Hip Portable 1 View Right  03/10/2014   CLINICAL DATA:  Post-op right hip surgery.  EXAM: DG C-ARM 1-60 MIN - NRPT [facility]; PORTABLE RIGHT HIP - 1 VIEW  COMPARISON:  03/10/2014  FINDINGS: Status post total hip arthroplasty. Single cross-table lateral view is performed showing femoral head prosthesis well seated within the acetabular prosthesis. No acute fracture identified.  IMPRESSION: No evidence for dislocation.   Electronically Signed   By: Rosalie GumsBeth  Brown M.D.   On: 03/10/2014 13:24   Dg C-arm 1-60 Min-no Report  03/10/2014   CLINICAL DATA:  Post-op right hip surgery.  EXAM: DG C-ARM 1-60 MIN - [facility]; PORTABLE RIGHT HIP - 1 VIEW  COMPARISON:  03/10/2014  FINDINGS: Status post total hip arthroplasty. Single cross-table lateral view is performed showing femoral head prosthesis well seated within the acetabular prosthesis. No acute fracture identified.  IMPRESSION: No evidence for dislocation.   Electronically Signed   By: Rosalie GumsBeth  Brown M.D.   On: 03/10/2014 13:24    Disposition:  To home      Discharge Instructions    Call MD / Call 911    Complete by:  As directed   If you experience chest pain or shortness  of breath, CALL 911 and be transported to the hospital emergency room.  If you develope a fever above 101 F, pus (white drainage) or increased drainage or redness at the wound, or calf pain, call your surgeon's office.     Constipation Prevention    Complete by:  As directed   Drink plenty of fluids.  Prune juice may be helpful.  You may use a stool softener, such as Colace (over the counter) 100 mg twice a day.  Use MiraLax (over the counter) for constipation as needed.     Diet - low sodium heart healthy    Complete by:  As directed      Discharge instructions    Complete by:  As directed   Increase your activities as comfort allows. You can get your current dressing wet daily in the shower. You can leave your current dressing on until your outpatient follow-up. Do take an over-the-counter stool softener twice daily as needed.     Discharge patient    Complete by:  As directed      Increase activity slowly as tolerated    Complete by:  As directed            Follow-up Information    Follow up with Kathryne HitchBLACKMAN,CHRISTOPHER Y, MD In 2 weeks.   Specialty:  Orthopedic Surgery   Contact information:   [address] [place] [address] [phone]        Signed: Kathryne HitchBLACKMAN,CHRISTOPHER Y 03/11/2014, 11:01 AM

## 2014-03-11 NOTE — Plan of Care (Signed)
Problem: Phase II Progression Outcomes Goal: Ambulates Outcome: Completed/Met Date Met:  03/11/14 Goal: Tolerating diet Outcome: Completed/Met Date Met:  03/11/14 Goal: Discharge plan established Outcome: Completed/Met Date Met:  03/11/14 Goal: Other Phase II Outcomes/Goals Outcome: Not Applicable Date Met:  03/11/14  Problem: Phase III Progression Outcomes Goal: Pain controlled on oral analgesia Outcome: Completed/Met Date Met:  03/11/14 Goal: Ambulates Outcome: Completed/Met Date Met:  03/11/14 Goal: Incision clean - minimal/no drainage Outcome: Completed/Met Date Met:  03/11/14 Goal: Discharge plan remains appropriate-arrangements made Outcome: Completed/Met Date Met:  03/11/14 Goal: Anticoagulant follow-up in place Outcome: Completed/Met Date Met:  03/11/14 Goal: Other Phase III Outcomes/Goals Outcome: Not Applicable Date Met:  03/11/14  Problem: Discharge Progression Outcomes Goal: Barriers To Progression Addressed/Resolved Outcome: Completed/Met Date Met:  03/11/14 Goal: CMS/Neurovascular status at or above baseline Outcome: Completed/Met Date Met:  03/11/14 Goal: Anticoagulant follow-up in place Outcome: Completed/Met Date Met:  03/11/14 Goal: Pain controlled with appropriate interventions Outcome: Completed/Met Date Met:  03/11/14 Goal: Hemodynamically stable Outcome: Completed/Met Date Met:  03/11/14 Goal: Complications resolved/controlled Outcome: Completed/Met Date Met:  03/11/14 Goal: Tolerates diet Outcome: Completed/Met Date Met:  03/11/14 Goal: Activity appropriate for discharge plan Outcome: Completed/Met Date Met:  03/11/14 Goal: Ambulates safely using assistive device Outcome: Completed/Met Date Met:  03/11/14 Goal: Follows weight - bearing limitations Outcome: Completed/Met Date Met:  03/11/14 Goal: Discharge plan in place and appropriate Outcome: Completed/Met Date Met:  03/11/14 Goal: Negotiates stairs Outcome: Completed/Met Date Met:   03/11/14 Goal: Demonstrates ADLs as appropriate Outcome: Completed/Met Date Met:  03/11/14 Goal: Incision without S/S infection Outcome: Completed/Met Date Met:  03/11/14     

## 2014-03-11 NOTE — Progress Notes (Signed)
Subjective: 1 Day Post-Op Procedure(s) (LRB): RIGHT TOTAL HIP ARTHROPLASTY ANTERIOR APPROACH (Right) Patient reports pain as moderate.  Fever likely due to atelectasis. Progressing well with therapy.  Objective: Vital signs in last 24 hours: Temp:  [98.1 F (36.7 C)-101.3 F (38.5 C)] 101.3 F (38.5 C) (11/14 1017) Pulse Rate:  [76-100] 91 (11/14 0956) Resp:  [9-19] 16 (11/14 0956) BP: (120-156)/(75-88) 126/78 mmHg (11/14 0956) SpO2:  [94 %-100 %] 99 % (11/14 0956) Weight:  [125.193 kg (276 lb)] 125.193 kg (276 lb) (11/13 1313)  Intake/Output from previous day: 11/13 0701 - 11/14 0700 In: 4597.5 [P.O.:1480; I.V.:3117.5] Out: 1750 [Urine:900; Blood:850] Intake/Output this shift: Total I/O In: 240 [P.O.:240] Out: -    Recent Labs  03/11/14 0524  HGB 11.7*    Recent Labs  03/11/14 0524  WBC 10.4  RBC 3.68*  HCT 35.4*  PLT 187    Recent Labs  03/11/14 0524  NA 131*  K 3.9  CL 94*  CO2 27  BUN 13  CREATININE 0.95  GLUCOSE 122*  CALCIUM 9.8   No results for input(s): LABPT, INR in the last 72 hours.  Sensation intact distally Intact pulses distally Dorsiflexion/Plantar flexion intact Incision: scant drainage  Assessment/Plan: 1 Day Post-Op Procedure(s) (LRB): RIGHT TOTAL HIP ARTHROPLASTY ANTERIOR APPROACH (Right) Up with therapy  Discharge to home this afternoon if clears therapy.  Ardyth Kelso Y 03/11/2014, 10:57 AM

## 2014-03-11 NOTE — Progress Notes (Signed)
Patient was not ready to wear CPAP. RT asked RN if she could cut the machine on for the patient. RT set up machine on patients home pressure of 10 cmH2O with 2 liter oxygen bleed in. Sterile water added to water chamber for humidification. RT will assess and monitor as needed.

## 2014-03-11 NOTE — Progress Notes (Signed)
Physical Therapy Treatment Patient Details Name: Donald [name] MRN: [id number] DOB: 11-Jan-1956 Today's Date: 03/11/2014    History of Present Illness R THR    PT Comments    Pt motivated and progressing well.    Follow Up Recommendations  Home health PT     Equipment Recommendations  None recommended by PT    Recommendations for Other Services OT consult     Precautions / Restrictions Precautions Precautions: Fall Restrictions Weight Bearing Restrictions: No Other Position/Activity Restrictions: WBAT    Mobility  Bed Mobility Overal bed mobility: Needs Assistance Bed Mobility: Supine to Sit     Supine to sit: Min assist;HOB elevated     General bed mobility comments: OOB with OT  Transfers Overall transfer level: Needs assistance Equipment used: Rolling walker (2 wheeled) Transfers: Sit to/from Stand Sit to Stand: Min assist         General transfer comment: verbal cues hand placement and LE management.  Ambulation/Gait Ambulation/Gait assistance: Min assist;Min guard Ambulation Distance (Feet): 100 Feet Assistive device: Rolling walker (2 wheeled) Gait Pattern/deviations: Step-to pattern;Decreased step length - right;Decreased step length - left;Shuffle;Trunk flexed Gait velocity: decr   General Gait Details: cues for sequence, posture and position from Rohm and HaasW   Stairs            Wheelchair Mobility    Modified Rankin (Stroke Patients Only)       Balance                                    Cognition Arousal/Alertness: Awake/alert Behavior During Therapy: WFL for tasks assessed/performed Overall Cognitive Status: Within Functional Limits for tasks assessed                      Exercises Total Joint Exercises Ankle Circles/Pumps: AROM;Both;15 reps;Supine Quad Sets: AROM;Both;10 reps;Supine Heel Slides: AAROM;Right;Supine;20 reps Hip ABduction/ADduction: AAROM;Right;Supine;15 reps    General Comments        Pertinent Vitals/Pain Pain Assessment: 0-10 Pain Score: 5  Pain Location: R hip Pain Descriptors / Indicators: Aching;Burning Pain Intervention(s): Limited activity within patient's tolerance;Monitored during session;Premedicated before session;Ice applied    Home Living Family/patient expects to be discharged to:: Private residence Living Arrangements: Spouse/significant other Available Help at Discharge: Family Type of Home: House Home Access: Stairs to enter Entrance Stairs-Rails: Right;Left;Can reach both Home Layout: Two level Home Equipment: Environmental consultantWalker - 2 wheels      Prior Function Level of Independence: Independent          PT Goals (current goals can now be found in the care plan section) Acute Rehab PT Goals Patient Stated Goal: none stated.  PT Goal Formulation: With patient Time For Goal Achievement: 03/17/14 Potential to Achieve Goals: Good Progress towards PT goals: Progressing toward goals    Frequency  7X/week    PT Plan Current plan remains appropriate    Co-evaluation             End of Session Equipment Utilized During Treatment: Gait belt Activity Tolerance: Patient tolerated treatment well Patient left: in chair;with call bell/phone within reach;with family/visitor present     Time: 1018-1050 PT Time Calculation (min) (ACUTE ONLY): 32 min  Charges:  $Gait Training: 8-22 mins $Therapeutic Exercise: 8-22 mins                    G Codes:      Donald [name] 03/11/2014,  1:00 PM

## 2014-03-13 ENCOUNTER — Encounter (HOSPITAL_COMMUNITY): Payer: Self-pay | Admitting: Orthopaedic Surgery

## 2015-06-08 ENCOUNTER — Other Ambulatory Visit: Payer: Self-pay | Admitting: Family Medicine

## 2015-06-08 ENCOUNTER — Ambulatory Visit
Admission: RE | Admit: 2015-06-08 | Discharge: 2015-06-08 | Disposition: A | Discharge status: Home / Self Care | Payer: 59 | Source: Ambulatory Visit | Attending: Family Medicine | Admitting: Family Medicine

## 2015-06-08 DIAGNOSIS — R0781 Pleurodynia: Secondary | ICD-10-CM

## 2015-06-08 DIAGNOSIS — R0789 Other chest pain: Secondary | ICD-10-CM

## 2016-01-30 ENCOUNTER — Ambulatory Visit (INDEPENDENT_AMBULATORY_CARE_PROVIDER_SITE_OTHER): Payer: 59 | Admitting: Orthopaedic Surgery

## 2016-01-30 DIAGNOSIS — M25552 Pain in left hip: Secondary | ICD-10-CM | POA: Diagnosis not present

## 2016-01-30 DIAGNOSIS — M7062 Trochanteric bursitis, left hip: Secondary | ICD-10-CM

## 2016-02-04 ENCOUNTER — Ambulatory Visit (INDEPENDENT_AMBULATORY_CARE_PROVIDER_SITE_OTHER): Payer: 59 | Admitting: Physical Medicine and Rehabilitation

## 2016-02-04 DIAGNOSIS — M25552 Pain in left hip: Secondary | ICD-10-CM | POA: Diagnosis not present

## 2016-02-27 ENCOUNTER — Ambulatory Visit (INDEPENDENT_AMBULATORY_CARE_PROVIDER_SITE_OTHER): Payer: 59 | Admitting: Orthopaedic Surgery

## 2016-02-27 DIAGNOSIS — M25552 Pain in left hip: Secondary | ICD-10-CM | POA: Diagnosis not present

## 2016-02-27 NOTE — Progress Notes (Signed)
Donald [name] is following up after having a trochanteric bursa injection of his left hip. I had also sent him to Dr. Alvester MorinNewton to consider a left hip intra-articular steroid injection. He said by the time he saw Dr. Alvester MorinNewton this hip pain had improved significantly to the point that he and Dr. Alvester MorinNewton decided to not pursue a steroid injection in his left hip.  On examination today he has full range of motion of his left hip and only some mild tenderness over trochanteric area is walking without a limp and doing well overall.  This point since he is doing so well follow up as needed. If he does develop pain in the groin, he will consider repeat injection or even an intra-articular hip injection

## 2016-02-27 NOTE — Progress Notes (Signed)
   Office Visit Note   Patient: Donald [name]           Date of Birth: 12/23/1955           MRN: [id number] Visit Date: 02/27/2016              Requested by: Farris HasAaron Morrow, MD [address] [place], [place] [address] PCP: Farris HasMORROW, AARON, MD   Assessment & Plan: Visit Diagnoses: No diagnosis found.  Plan: follow-up prn  Follow-Up Instructions: No Follow-up on file.   Orders:  No orders of the defined types were placed in this encounter.  No orders of the defined types were placed in this encounter.     Procedures: No procedures performed   Clinical Data: No additional findings.   Subjective: Chief Complaint  Patient presents with  . Left Hip - Pain, Follow-up    HPI  Review of Systems   Objective: Vital Signs: There were no vitals taken for this visit.  Physical Exam  Ortho Exam  Specialty Comments:  No specialty comments available.  Imaging: No results found.   PMFS History: Patient Active Problem List   Diagnosis Date Noted  . Primary osteoarthritis of right hip 03/10/2014  . Status post total replacement of right hip 03/10/2014  . Diastasis recti 02/21/2013   Past Medical History:  Diagnosis Date  . Asthma   . Hyperlipidemia   . Hypertension   . Sleep apnea    cpap settings ? 10     No family history on file.  Past Surgical History:  Procedure Laterality Date  . HERNIA REPAIR     x 2   . SINUS EXPLORATION    . TOTAL HIP ARTHROPLASTY Right 03/10/2014   Procedure: RIGHT TOTAL HIP ARTHROPLASTY ANTERIOR APPROACH;  Surgeon: Kathryne Hitchhristopher Y Alexica Schlossberg, MD;  Location: [facility];  Service: Orthopedics;  Laterality: Right;  . URETEROSCOPY     multiple times    Social History   Occupational History  . Not on file.   Social History Main Topics  . Smoking status: Former Smoker    Types: Cigarettes  . Smokeless tobacco: Never Used     Comment: patient quit smoking around 25  . Alcohol use 21.0 oz/week    21 Cans of beer, 14  Shots of liquor per week  . Drug use: No  . Sexual activity: Not on file

## 2016-07-15 ENCOUNTER — Other Ambulatory Visit: Payer: Self-pay | Admitting: Family Medicine

## 2016-07-15 ENCOUNTER — Ambulatory Visit
Admission: RE | Admit: 2016-07-15 | Discharge: 2016-07-15 | Disposition: A | Discharge status: Home / Self Care | Payer: BLUE CROSS/BLUE SHIELD | Source: Ambulatory Visit | Attending: Family Medicine | Admitting: Family Medicine

## 2016-07-15 DIAGNOSIS — R059 Cough, unspecified: Secondary | ICD-10-CM

## 2016-07-15 DIAGNOSIS — R05 Cough: Secondary | ICD-10-CM

## 2016-12-25 ENCOUNTER — Encounter (INDEPENDENT_AMBULATORY_CARE_PROVIDER_SITE_OTHER): Payer: Self-pay | Admitting: Physician Assistant

## 2016-12-25 ENCOUNTER — Ambulatory Visit (INDEPENDENT_AMBULATORY_CARE_PROVIDER_SITE_OTHER): Payer: BLUE CROSS/BLUE SHIELD | Admitting: Physician Assistant

## 2016-12-25 DIAGNOSIS — M25552 Pain in left hip: Secondary | ICD-10-CM

## 2016-12-25 MED ORDER — METHYLPREDNISOLONE ACETATE 40 MG/ML IJ SUSP
40.0000 mg | INTRAMUSCULAR | Status: AC | PRN
  Administered 2016-12-25: 40 mg via INTRA_ARTICULAR

## 2016-12-25 MED ORDER — BUPIVACAINE HCL 0.25 % IJ SOLN
4.0000 mL | INTRAMUSCULAR | Status: AC | PRN
  Administered 2016-12-25: 4 mL via INTRA_ARTICULAR

## 2016-12-25 NOTE — Progress Notes (Signed)
Office Visit Note   Patient: Hazel SamsDwight E Murtha           Date of Birth: [birth year]           MRN: [id number] Visit Date: 12/25/2016              Requested by: Farris HasMorrow, Aaron, MD [address] [place], [place] [address] PCP: Farris HasMorrow, Aaron, MD   Assessment & Plan: Visit Diagnoses:  1. Pain of left hip joint     Plan: He'll continue to work on IT band stretching. Discussed little if no relief with the injection recommend that he have any intra-articular injection in the left hip with Dr. Alvester MorinNewton. Like to see him back in 4 weeks' check his progress or lack of. Questions are encouraged and answered length today. If he continues to have a hip pain at one month follow-up like an AP pelvis and a lateral view of his left hip.  Follow-Up Instructions: Return in about 4 weeks (around 01/22/2017).   Orders:  Orders Placed This Encounter  Procedures  . Large Joint Injection/Arthrocentesis   No orders of the defined types were placed in this encounter.     Procedures: Large Joint Inj Date/Time: 12/25/2016 3:59 PM Performed by: Kirtland BouchardLARK, Aaryanna Hyden W Authorized by: Kirtland BouchardLARK, Langston Tuberville W   Consent Given by:  Patient Indications:  Pain Location:  Hip Site:  L greater trochanter Needle Size:  22 G Needle Length:  1.5 inches Approach:  Lateral Ultrasound Guidance: No   Fluoroscopic Guidance: No   Arthrogram: No   Medications:  40 mg methylPREDNISolone acetate 40 MG/ML; 4 mL bupivacaine 0.25 % Aspiration Attempted: No   Patient tolerance:  Patient tolerated the procedure well with no immediate complications     Clinical Data: No additional findings.   Subjective: Chief Complaint  Patient presents with  . Left Hip - Pain, Follow-up    HPI Mr. Melchor AmourHerrin Comes in today with a left lateral hip pain. Does feel like the hip give way at times. He is asking for another trochanteric injection. He states this is done as well as the intra-articular injections in the past. He is status  post right total hip arthroplasty 3 years postop. Having no radicular symptoms down the leg. Pain in the left hip will get better then it get worse. Does take Tylenol and Advil for this. Pain varies as 6 out of 10 at worst. Review of Systems Please see history of present illness otherwise negative  Objective: Vital Signs: There were no vitals taken for this visit.  Physical Exam  Constitutional: He is oriented to person, place, and time. He appears well-developed and well-nourished. No distress.  Neurological: He is alert and oriented to person, place, and time.  Skin: He is not diaphoretic.  Psychiatric: He has a normal mood and affect.    Ortho Exam Bilateral hips is good range of motion of both hips no pain with range of motion of the right hip left hip he has pain with internal rotation. Slight tenderness over the left trochanteric region. Specialty Comments:  No specialty comments available.  Imaging: No results found.   PMFS History: Patient Active Problem List   Diagnosis Date Noted  . Primary osteoarthritis of right hip 03/10/2014  . Status post total replacement of right hip 03/10/2014  . Diastasis recti 02/21/2013   Past Medical History:  Diagnosis Date  . Asthma   . Hyperlipidemia   . Hypertension   . Sleep apnea  cpap settings ? 10     No family history on file.  Past Surgical History:  Procedure Laterality Date  . HERNIA REPAIR     x 2   . SINUS EXPLORATION    . TOTAL HIP ARTHROPLASTY Right 03/10/2014   Procedure: RIGHT TOTAL HIP ARTHROPLASTY ANTERIOR APPROACH;  Surgeon: Kathryne Hitch, MD;  Location: [facility];  Service: Orthopedics;  Laterality: Right;  . URETEROSCOPY     multiple times    Social History   Occupational History  . Not on file.   Social History Main Topics  . Smoking status: Former Smoker    Types: Cigarettes  . Smokeless tobacco: Never Used     Comment: patient quit smoking around 25  . Alcohol use 21.0 oz/week    21  Cans of beer, 14 Shots of liquor per week  . Drug use: No  . Sexual activity: Not on file

## 2017-01-05 ENCOUNTER — Telehealth (INDEPENDENT_AMBULATORY_CARE_PROVIDER_SITE_OTHER): Payer: Self-pay | Admitting: Physical Medicine and Rehabilitation

## 2017-01-05 DIAGNOSIS — M25552 Pain in left hip: Secondary | ICD-10-CM

## 2017-01-05 NOTE — Telephone Encounter (Signed)
Please see Gil's response. I entered referral. Thanks.

## 2017-01-05 NOTE — Telephone Encounter (Signed)
See messages below  Please advise

## 2017-01-05 NOTE — Telephone Encounter (Signed)
Get with Bronson CurbGil and see what he thinks, if Bronson CurbGil feels that is warranted then ok to schedule but please inform patient injection only works if there is pain and would not be performed at scheduled time if no pain.

## 2017-01-05 NOTE — Telephone Encounter (Signed)
Yes needs a intra-articular injection with Dr. Alvester MorinNewton for his hip

## 2017-01-05 NOTE — Telephone Encounter (Signed)
Please see messages below. Patient is requesting a hip injection from Dr. Alvester MorinNewton in October. Please advise.

## 2017-01-06 NOTE — Telephone Encounter (Signed)
See messages in referral.

## 2017-01-14 ENCOUNTER — Ambulatory Visit (INDEPENDENT_AMBULATORY_CARE_PROVIDER_SITE_OTHER): Payer: BLUE CROSS/BLUE SHIELD | Admitting: Orthopaedic Surgery

## 2017-01-14 ENCOUNTER — Ambulatory Visit (INDEPENDENT_AMBULATORY_CARE_PROVIDER_SITE_OTHER): Payer: BLUE CROSS/BLUE SHIELD

## 2017-01-14 DIAGNOSIS — M25552 Pain in left hip: Secondary | ICD-10-CM | POA: Diagnosis not present

## 2017-01-14 DIAGNOSIS — M1612 Unilateral primary osteoarthritis, left hip: Secondary | ICD-10-CM | POA: Diagnosis not present

## 2017-01-14 NOTE — Progress Notes (Signed)
Office Visit Note   Patient: Donald [name]           Date of Birth: July 02, 1955           MRN: [id number] Visit Date: 01/14/2017              Requested by: Farris Has, MD [address] [place], [place] [address] PCP: Farris Has, MD   Assessment & Plan: Visit Diagnoses:  1. Pain in left hip   2. Unilateral primary osteoarthritis, left hip     Plan: If he decides to have another intra-articular steroid injection in his left hip he can call the office and we can set this up without me seeing him. His because is well-documented he has arthritis and pain in the left hip. He understands he should not have an injection again until the winter time. I was if his pain worsens he will call or we can set him up for hip replacement surgery if needed. It is deathly clinically indicated and radiographically indicated at this point.  Follow-Up Instructions: Return if symptoms worsen or fail to improve.   Orders:  Orders Placed This Encounter  Procedures  . XR HIP UNILAT W OR W/O PELVIS 1V LEFT   No orders of the defined types were placed in this encounter.     Procedures: No procedures performed   Clinical Data: No additional findings.   Subjective: No chief complaint on file. The patient is well-known to me. He is almost 3 years out from her right total hip arthroplasty due to severe arthritis and femoral acetabular impingement. His left hip started bothering him back in July. He recently just 2 weeks ago had a intraocular steroid injection in the left hip by Dr. Alvester Morin. He said this worked well, but he still has daily pain. He was to consider hip replacement surgery sometime in the future but is not ready yet for that. He like to consider another steroid injection later in the year depending on how his recovery is from this injection and what is going on with his pain.  HPI  Review of Systems He currently denies any headache, chest pain, short of breath,  fever, chills, nausea, vomiting.  Objective: Vital Signs: There were no vitals taken for this visit.  Physical Exam His alert and oriented 3 in no acute distress Ortho Exam Examination of his right hip is normal. Examination his left hip shows pain with internal and external rotation in the groin. Specialty Comments:  No specialty comments available.  Imaging: Xr Hip Unilat W Or W/o Pelvis 1v Left  Result Date: 01/14/2017 An AP pelvis and lateral of his left hip shows a well-seated total hip arthroplasty on the right side. His left hip shows severe end-stage arthritis. There is joint space narrowing. There is evidence of femoral acetabular impingement. There sclerotic changes and periarticular osteophytes.    PMFS History: Patient Active Problem List   Diagnosis Date Noted  . Primary osteoarthritis of right hip 03/10/2014  . Status post total replacement of right hip 03/10/2014  . Diastasis recti 02/21/2013   Past Medical History:  Diagnosis Date  . Asthma   . Hyperlipidemia   . Hypertension   . Sleep apnea    cpap settings ? 10     No family history on file.  Past Surgical History:  Procedure Laterality Date  . HERNIA REPAIR     x 2   . SINUS EXPLORATION    . TOTAL HIP  ARTHROPLASTY Right 03/10/2014   Procedure: RIGHT TOTAL HIP ARTHROPLASTY ANTERIOR APPROACH;  Surgeon: Kathryne Hitch, MD;  Location: [facility];  Service: Orthopedics;  Laterality: Right;  . URETEROSCOPY     multiple times    Social History   Occupational History  . Not on file.   Social History Main Topics  . Smoking status: Former Smoker    Types: Cigarettes  . Smokeless tobacco: Never Used     Comment: patient quit smoking around 25  . Alcohol use 21.0 oz/week    21 Cans of beer, 14 Shots of liquor per week  . Drug use: No  . Sexual activity: Not on file

## 2017-01-22 ENCOUNTER — Ambulatory Visit (INDEPENDENT_AMBULATORY_CARE_PROVIDER_SITE_OTHER): Payer: BLUE CROSS/BLUE SHIELD | Admitting: Physician Assistant

## 2017-01-29 ENCOUNTER — Other Ambulatory Visit (INDEPENDENT_AMBULATORY_CARE_PROVIDER_SITE_OTHER): Payer: Self-pay | Admitting: Physician Assistant

## 2017-02-04 ENCOUNTER — Other Ambulatory Visit (HOSPITAL_COMMUNITY): Payer: Self-pay | Admitting: Emergency Medicine

## 2017-02-04 NOTE — Patient Instructions (Addendum)
Donald [name]  02/04/2017   Your procedure is scheduled on: 02-13-17  Report to [facility] Main  Entrance     Follow signs to Short Stay on first floor at 515AM   Call this number if you have problems the morning of surgery  [phone]   Remember: ONLY 1 PERSON MAY GO WITH YOU TO SHORT STAY TO GET  READY MORNING OF YOUR SURGERY.  Do not eat food or drink liquids :After Midnight.    Bring CPAP mask and tubing ti the hospital. The device will be provided for you!    Take these medicines the morning of surgery with A SIP OF WATER: amlodipine(norvasc), tylenol as needed, inhaler as needed (may bring to hospital)                                 You may not have any metal on your body including hair pins and              piercings  Do not wear jewelry, make-up, lotions, powders or perfumes, deodorant              Men may shave face and neck.   Do not bring valuables to the hospital. [facility] IS NOT             RESPONSIBLE   FOR VALUABLES.  Contacts, dentures or bridgework may not be worn into surgery.  Leave suitcase in the car. After surgery it may be brought to your room.                Please read over the following fact sheets you were given: _____________________________________________________________________            [facility] - Preparing for Surgery Before surgery, you can play an important role.  Because skin is not sterile, your skin needs to be as free of germs as possible.  You can reduce the number of germs on your skin by washing with CHG (chlorahexidine gluconate) soap before surgery.  CHG is an antiseptic cleaner which kills germs and bonds with the skin to continue killing germs even after washing. Please DO NOT use if you have an allergy to CHG or antibacterial soaps.  If your skin becomes reddened/irritated stop using the CHG and inform your nurse when you arrive at Short Stay. Do not shave (including legs and underarms) for at  least 48 hours prior to the first CHG shower.  You may shave your face/neck. Please follow these instructions carefully:  1.  Shower with CHG Soap the night before surgery and the  morning of Surgery.  2.  If you choose to wash your hair, wash your hair first as usual with your  normal  shampoo.  3.  After you shampoo, rinse your hair and body thoroughly to remove the  shampoo.                           4.  Use CHG as you would any other liquid soap.  You can apply chg directly  to the skin and wash                       Gently with a scrungie or clean washcloth.  5.  Apply the CHG Soap  to your body ONLY FROM THE NECK DOWN.   Do not use on face/ open                           Wound or open sores. Avoid contact with eyes, ears mouth and genitals (private parts).                       Wash face,  Genitals (private parts) with your normal soap.             6.  Wash thoroughly, paying special attention to the area where your surgery  will be performed.  7.  Thoroughly rinse your body with warm water from the neck down.  8.  DO NOT shower/wash with your normal soap after using and rinsing off  the CHG Soap.                9.  Pat yourself dry with a clean towel.            10.  Wear clean pajamas.            11.  Place clean sheets on your bed the night of your first shower and do not  sleep with pets. Day of Surgery : Do not apply any lotions/deodorants the morning of surgery.  Please wear clean clothes to the hospital/surgery center.  FAILURE TO FOLLOW THESE INSTRUCTIONS MAY RESULT IN THE CANCELLATION OF YOUR SURGERY PATIENT SIGNATURE_________________________________  NURSE SIGNATURE__________________________________  ________________________________________________________________________   Adam Phenix  An incentive spirometer is a tool that can help keep your lungs clear and active. This tool measures how well you are filling your lungs with each breath. Taking long deep breaths  may help reverse or decrease the chance of developing breathing (pulmonary) problems (especially infection) following:  A long period of time when you are unable to move or be active. BEFORE THE PROCEDURE   If the spirometer includes an indicator to show your best effort, your nurse or respiratory therapist will set it to a desired goal.  If possible, sit up straight or lean slightly forward. Try not to slouch.  Hold the incentive spirometer in an upright position. INSTRUCTIONS FOR USE  1. Sit on the edge of your bed if possible, or sit up as far as you can in bed or on a chair. 2. Hold the incentive spirometer in an upright position. 3. Breathe out normally. 4. Place the mouthpiece in your mouth and seal your lips tightly around it. 5. Breathe in slowly and as deeply as possible, raising the piston or the ball toward the top of the column. 6. Hold your breath for 3-5 seconds or for as long as possible. Allow the piston or ball to fall to the bottom of the column. 7. Remove the mouthpiece from your mouth and breathe out normally. 8. Rest for a few seconds and repeat Steps 1 through 7 at least 10 times every 1-2 hours when you are awake. Take your time and take a few normal breaths between deep breaths. 9. The spirometer may include an indicator to show your best effort. Use the indicator as a goal to work toward during each repetition. 10. After each set of 10 deep breaths, practice coughing to be sure your lungs are clear. If you have an incision (the cut made at the time of surgery), support your incision when coughing by placing a pillow or rolled up towels firmly  against it. Once you are able to get out of bed, walk around indoors and cough well. You may stop using the incentive spirometer when instructed by your caregiver.  RISKS AND COMPLICATIONS  Take your time so you do not get dizzy or light-headed.  If you are in pain, you may need to take or ask for pain medication before doing  incentive spirometry. It is harder to take a deep breath if you are having pain. AFTER USE  Rest and breathe slowly and easily.  It can be helpful to keep track of a log of your progress. Your caregiver can provide you with a simple table to help with this. If you are using the spirometer at home, follow these instructions: SEEK MEDICAL CARE IF:   You are having difficultly using the spirometer.  You have trouble using the spirometer as often as instructed.  Your pain medication is not giving enough relief while using the spirometer.  You develop fever of 100.5 F (38.1 C) or higher. SEEK IMMEDIATE MEDICAL CARE IF:   You cough up bloody sputum that had not been present before.  You develop fever of 102 F (38.9 C) or greater.  You develop worsening pain at or near the incision site. MAKE SURE YOU:   Understand these instructions.  Will watch your condition.  Will get help right away if you are not doing well or get worse. Document Released: 08/25/2006 Document Revised: 07/07/2011 Document Reviewed: 10/26/2006 [facility] Patient Information 2014 [place], [place].   ________________________________________________________________________

## 2017-02-04 NOTE — Progress Notes (Signed)
CXR 07-15-16 epic

## 2017-02-05 ENCOUNTER — Encounter (HOSPITAL_COMMUNITY)
Admission: RE | Admit: 2017-02-05 | Discharge: 2017-02-05 | Disposition: A | Discharge status: Home / Self Care | Payer: BLUE CROSS/BLUE SHIELD | Source: Ambulatory Visit | Attending: Orthopaedic Surgery | Admitting: Orthopaedic Surgery

## 2017-02-05 ENCOUNTER — Encounter (HOSPITAL_COMMUNITY): Payer: Self-pay

## 2017-02-05 DIAGNOSIS — M1612 Unilateral primary osteoarthritis, left hip: Secondary | ICD-10-CM | POA: Insufficient documentation

## 2017-02-05 DIAGNOSIS — Z01818 Encounter for other preprocedural examination: Secondary | ICD-10-CM | POA: Insufficient documentation

## 2017-02-05 DIAGNOSIS — I10 Essential (primary) hypertension: Secondary | ICD-10-CM | POA: Diagnosis not present

## 2017-02-05 HISTORY — DX: Unspecified osteoarthritis, unspecified site: M19.90

## 2017-02-05 LAB — SURGICAL PCR SCREEN
MRSA, PCR: NEGATIVE
Staphylococcus aureus: NEGATIVE

## 2017-02-05 LAB — BASIC METABOLIC PANEL
ANION GAP: 9 (ref 5–15)
BUN: 11 mg/dL (ref 6–20)
CALCIUM: 10.5 mg/dL — AB (ref 8.9–10.3)
CO2: 28 mmol/L (ref 22–32)
CREATININE: 0.82 mg/dL (ref 0.61–1.24)
Chloride: 95 mmol/L — ABNORMAL LOW (ref 101–111)
GFR calc Af Amer: >60 mL/min (ref >60)
GFR calc non Af Amer: >60 mL/min (ref >60)
GLUCOSE: 118 mg/dL — AB (ref 65–99)
Potassium: 4.2 mmol/L (ref 3.5–5.1)
Sodium: 132 mmol/L — ABNORMAL LOW (ref 135–145)

## 2017-02-05 LAB — CBC
HCT: 42 % (ref 39.0–52.0)
HEMOGLOBIN: 14.2 g/dL (ref 13.0–17.0)
MCH: 32.5 pg (ref 26.0–34.0)
MCHC: 33.8 g/dL (ref 30.0–36.0)
MCV: 96.1 fL (ref 78.0–100.0)
Platelets: 227 10*3/uL (ref 150–400)
RBC: 4.37 MIL/uL (ref 4.22–5.81)
RDW: 12.7 % (ref 11.5–15.5)
WBC: 8.5 10*3/uL (ref 4.0–10.5)

## 2017-02-11 ENCOUNTER — Other Ambulatory Visit (INDEPENDENT_AMBULATORY_CARE_PROVIDER_SITE_OTHER): Payer: Self-pay

## 2017-02-12 MED ORDER — DEXTROSE 5 % IV SOLN
3.0000 g | INTRAVENOUS | Status: AC
  Administered 2017-02-13: 3 g via INTRAVENOUS
  Filled 2017-02-12: qty 3

## 2017-02-13 ENCOUNTER — Encounter (HOSPITAL_COMMUNITY): Payer: Self-pay

## 2017-02-13 ENCOUNTER — Inpatient Hospital Stay (HOSPITAL_COMMUNITY)
Admission: RE | Admit: 2017-02-13 | Discharge: 2017-02-14 | DRG: 470 | Disposition: A | Discharge status: Home Health | Payer: BLUE CROSS/BLUE SHIELD | Source: Ambulatory Visit | Attending: Orthopaedic Surgery | Admitting: Orthopaedic Surgery

## 2017-02-13 ENCOUNTER — Inpatient Hospital Stay (HOSPITAL_COMMUNITY): Payer: BLUE CROSS/BLUE SHIELD

## 2017-02-13 ENCOUNTER — Inpatient Hospital Stay (HOSPITAL_COMMUNITY): Payer: BLUE CROSS/BLUE SHIELD | Admitting: Anesthesiology

## 2017-02-13 ENCOUNTER — Telehealth (INDEPENDENT_AMBULATORY_CARE_PROVIDER_SITE_OTHER): Payer: Self-pay | Admitting: Orthopaedic Surgery

## 2017-02-13 ENCOUNTER — Encounter (HOSPITAL_COMMUNITY)
Admission: RE | Disposition: A | Discharge status: Home Health | Payer: Self-pay | Source: Ambulatory Visit | Attending: Orthopaedic Surgery

## 2017-02-13 DIAGNOSIS — M1612 Unilateral primary osteoarthritis, left hip: Principal | ICD-10-CM | POA: Diagnosis present

## 2017-02-13 DIAGNOSIS — Z96641 Presence of right artificial hip joint: Secondary | ICD-10-CM | POA: Diagnosis present

## 2017-02-13 DIAGNOSIS — E785 Hyperlipidemia, unspecified: Secondary | ICD-10-CM | POA: Diagnosis present

## 2017-02-13 DIAGNOSIS — I10 Essential (primary) hypertension: Secondary | ICD-10-CM | POA: Diagnosis present

## 2017-02-13 DIAGNOSIS — Z6841 Body Mass Index (BMI) 40.0 and over, adult: Secondary | ICD-10-CM

## 2017-02-13 DIAGNOSIS — Z79899 Other long term (current) drug therapy: Secondary | ICD-10-CM

## 2017-02-13 DIAGNOSIS — M25552 Pain in left hip: Secondary | ICD-10-CM | POA: Diagnosis present

## 2017-02-13 DIAGNOSIS — J45909 Unspecified asthma, uncomplicated: Secondary | ICD-10-CM | POA: Diagnosis present

## 2017-02-13 DIAGNOSIS — Z87891 Personal history of nicotine dependence: Secondary | ICD-10-CM | POA: Diagnosis not present

## 2017-02-13 DIAGNOSIS — Z96642 Presence of left artificial hip joint: Secondary | ICD-10-CM

## 2017-02-13 DIAGNOSIS — G4730 Sleep apnea, unspecified: Secondary | ICD-10-CM | POA: Diagnosis present

## 2017-02-13 DIAGNOSIS — Z419 Encounter for procedure for purposes other than remedying health state, unspecified: Secondary | ICD-10-CM

## 2017-02-13 HISTORY — PX: TOTAL HIP ARTHROPLASTY: SHX124

## 2017-02-13 LAB — TYPE AND SCREEN
ABO/RH(D): O POS
Antibody Screen: NEGATIVE

## 2017-02-13 SURGERY — ARTHROPLASTY, HIP, TOTAL, ANTERIOR APPROACH
Anesthesia: General | Site: Hip | Laterality: Left

## 2017-02-13 MED ORDER — ONDANSETRON HCL 4 MG/2ML IJ SOLN
INTRAMUSCULAR | Status: DC | PRN
  Administered 2017-02-13: 4 mg via INTRAVENOUS

## 2017-02-13 MED ORDER — ONDANSETRON HCL 4 MG/2ML IJ SOLN
INTRAMUSCULAR | Status: AC
  Filled 2017-02-13: qty 2

## 2017-02-13 MED ORDER — SALINE SPRAY 0.65 % NA SOLN: 2.0000 | Freq: Every day | NASAL | Status: DC | PRN

## 2017-02-13 MED ORDER — HYDROMORPHONE HCL 1 MG/ML IJ SOLN
1.0000 mg | INTRAMUSCULAR | Status: DC | PRN
  Administered 2017-02-14 (×2): 1 mg via INTRAVENOUS
  Filled 2017-02-13 (×2): qty 1

## 2017-02-13 MED ORDER — ACETAMINOPHEN 650 MG RE SUPP: 650.0000 mg | RECTAL | Status: DC | PRN

## 2017-02-13 MED ORDER — PROMETHAZINE HCL 25 MG/ML IJ SOLN
INTRAMUSCULAR | Status: AC
  Filled 2017-02-13: qty 1

## 2017-02-13 MED ORDER — HYDROMORPHONE HCL-NACL 0.5-0.9 MG/ML-% IV SOSY
PREFILLED_SYRINGE | INTRAVENOUS | Status: AC
  Filled 2017-02-13: qty 1

## 2017-02-13 MED ORDER — FENTANYL CITRATE (PF) 100 MCG/2ML IJ SOLN
INTRAMUSCULAR | Status: AC
  Filled 2017-02-13: qty 2

## 2017-02-13 MED ORDER — ACETAMINOPHEN 325 MG PO TABS
650.0000 mg | ORAL_TABLET | ORAL | Status: DC | PRN
  Administered 2017-02-13: 12:00:00 650 mg via ORAL
  Filled 2017-02-13: qty 2

## 2017-02-13 MED ORDER — POLYETHYLENE GLYCOL 3350 17 G PO PACK: 17.0000 g | PACK | Freq: Every day | ORAL | Status: DC | PRN

## 2017-02-13 MED ORDER — LIDOCAINE 2% (20 MG/ML) 5 ML SYRINGE
INTRAMUSCULAR | Status: AC
  Filled 2017-02-13: qty 5

## 2017-02-13 MED ORDER — OXYCODONE HCL 5 MG PO TABS
5.0000 mg | ORAL_TABLET | ORAL | Status: DC | PRN
  Administered 2017-02-13 (×2): 5 mg via ORAL
  Filled 2017-02-13 (×2): qty 1

## 2017-02-13 MED ORDER — SUGAMMADEX SODIUM 500 MG/5ML IV SOLN
INTRAVENOUS | Status: AC
  Filled 2017-02-13: qty 5

## 2017-02-13 MED ORDER — OXYCODONE HCL 5 MG PO TABS
10.0000 mg | ORAL_TABLET | ORAL | Status: DC | PRN
  Administered 2017-02-13 – 2017-02-14 (×5): 10 mg via ORAL
  Filled 2017-02-13 (×5): qty 2

## 2017-02-13 MED ORDER — ONDANSETRON HCL 4 MG PO TABS
4.0000 mg | ORAL_TABLET | Freq: Four times a day (QID) | ORAL | Status: DC | PRN
Start: 2017-02-13 — End: 2017-02-14

## 2017-02-13 MED ORDER — METOCLOPRAMIDE HCL 5 MG/ML IJ SOLN: 5.0000 mg | Freq: Three times a day (TID) | INTRAMUSCULAR | Status: DC | PRN

## 2017-02-13 MED ORDER — METOCLOPRAMIDE HCL 5 MG PO TABS: 5.0000 mg | ORAL_TABLET | Freq: Three times a day (TID) | ORAL | Status: DC | PRN

## 2017-02-13 MED ORDER — SODIUM CHLORIDE 0.9 % IR SOLN
Status: DC | PRN
  Administered 2017-02-13: 1000 mL

## 2017-02-13 MED ORDER — HYDROMORPHONE HCL-NACL 0.5-0.9 MG/ML-% IV SOSY
0.2500 mg | PREFILLED_SYRINGE | INTRAVENOUS | Status: DC | PRN
  Administered 2017-02-13 (×5): 0.5 mg via INTRAVENOUS

## 2017-02-13 MED ORDER — ROCURONIUM BROMIDE 10 MG/ML (PF) SYRINGE
PREFILLED_SYRINGE | INTRAVENOUS | Status: DC | PRN
  Administered 2017-02-13: 50 mg via INTRAVENOUS
  Administered 2017-02-13 (×2): 20 mg via INTRAVENOUS

## 2017-02-13 MED ORDER — LACTATED RINGERS IV SOLN
INTRAVENOUS | Status: DC
  Administered 2017-02-13: 10:00:00 via INTRAVENOUS
  Administered 2017-02-13: 1000 mL via INTRAVENOUS

## 2017-02-13 MED ORDER — DOCUSATE SODIUM 100 MG PO CAPS
100.0000 mg | ORAL_CAPSULE | Freq: Two times a day (BID) | ORAL | Status: DC
  Administered 2017-02-13 – 2017-02-14 (×2): 100 mg via ORAL
  Filled 2017-02-13 (×2): qty 1

## 2017-02-13 MED ORDER — ONDANSETRON HCL 4 MG/2ML IJ SOLN: 4.0000 mg | Freq: Four times a day (QID) | INTRAMUSCULAR | Status: DC | PRN

## 2017-02-13 MED ORDER — FENTANYL CITRATE (PF) 100 MCG/2ML IJ SOLN
INTRAMUSCULAR | Status: DC | PRN
  Administered 2017-02-13 (×3): 100 ug via INTRAVENOUS

## 2017-02-13 MED ORDER — SODIUM CHLORIDE 0.9 % IV SOLN
INTRAVENOUS | Status: DC
  Administered 2017-02-13 (×2): via INTRAVENOUS

## 2017-02-13 MED ORDER — PHENYLEPHRINE 40 MCG/ML (10ML) SYRINGE FOR IV PUSH (FOR BLOOD PRESSURE SUPPORT)
PREFILLED_SYRINGE | INTRAVENOUS | Status: DC | PRN
  Administered 2017-02-13: 80 ug via INTRAVENOUS

## 2017-02-13 MED ORDER — AMLODIPINE BESYLATE 10 MG PO TABS
10.0000 mg | ORAL_TABLET | Freq: Every morning | ORAL | Status: DC
  Administered 2017-02-14: 10 mg via ORAL
  Filled 2017-02-13: qty 1

## 2017-02-13 MED ORDER — LIDOCAINE 2% (20 MG/ML) 5 ML SYRINGE
INTRAMUSCULAR | Status: DC | PRN
  Administered 2017-02-13: 100 mg via INTRAVENOUS

## 2017-02-13 MED ORDER — STERILE WATER FOR IRRIGATION IR SOLN
Status: DC | PRN
  Administered 2017-02-13: 3000 mL

## 2017-02-13 MED ORDER — SUGAMMADEX SODIUM 500 MG/5ML IV SOLN
INTRAVENOUS | Status: DC | PRN
  Administered 2017-02-13: 300 mg via INTRAVENOUS

## 2017-02-13 MED ORDER — MIDAZOLAM HCL 5 MG/5ML IJ SOLN
INTRAMUSCULAR | Status: DC | PRN
  Administered 2017-02-13: 2 mg via INTRAVENOUS

## 2017-02-13 MED ORDER — MENTHOL 3 MG MT LOZG: 1.0000 | LOZENGE | OROMUCOSAL | Status: DC | PRN

## 2017-02-13 MED ORDER — MIDAZOLAM HCL 2 MG/2ML IJ SOLN
INTRAMUSCULAR | Status: AC
  Filled 2017-02-13: qty 2

## 2017-02-13 MED ORDER — ALBUTEROL SULFATE HFA 108 (90 BASE) MCG/ACT IN AERS: 1.0000 | INHALATION_SPRAY | RESPIRATORY_TRACT | Status: DC | PRN

## 2017-02-13 MED ORDER — HYDROMORPHONE HCL-NACL 0.5-0.9 MG/ML-% IV SOSY
PREFILLED_SYRINGE | INTRAVENOUS | Status: AC
  Filled 2017-02-13: qty 3

## 2017-02-13 MED ORDER — BUPIVACAINE HCL (PF) 0.5 % IJ SOLN
INTRAMUSCULAR | Status: AC
Start: 2017-02-13 — End: 2017-02-13
  Filled 2017-02-13: qty 30

## 2017-02-13 MED ORDER — ALUM & MAG HYDROXIDE-SIMETH 200-200-20 MG/5ML PO SUSP: 30.0000 mL | ORAL | Status: DC | PRN

## 2017-02-13 MED ORDER — SUCCINYLCHOLINE CHLORIDE 200 MG/10ML IV SOSY
PREFILLED_SYRINGE | INTRAVENOUS | Status: DC | PRN
  Administered 2017-02-13: 140 mg via INTRAVENOUS

## 2017-02-13 MED ORDER — ROCURONIUM BROMIDE 50 MG/5ML IV SOSY
PREFILLED_SYRINGE | INTRAVENOUS | Status: AC
  Filled 2017-02-13: qty 5

## 2017-02-13 MED ORDER — SUCCINYLCHOLINE CHLORIDE 200 MG/10ML IV SOSY
PREFILLED_SYRINGE | INTRAVENOUS | Status: AC
  Filled 2017-02-13: qty 10

## 2017-02-13 MED ORDER — PROPOFOL 10 MG/ML IV BOLUS
INTRAVENOUS | Status: DC | PRN
Start: 2017-02-13 — End: 2017-02-13
  Administered 2017-02-13: 300 mg via INTRAVENOUS

## 2017-02-13 MED ORDER — METHOCARBAMOL 1000 MG/10ML IJ SOLN
500.0000 mg | Freq: Four times a day (QID) | INTRAVENOUS | Status: DC | PRN
  Administered 2017-02-13: 500 mg via INTRAVENOUS
  Filled 2017-02-13: qty 550

## 2017-02-13 MED ORDER — CHLORHEXIDINE GLUCONATE 4 % EX LIQD: 60.0000 mL | Freq: Once | CUTANEOUS | Status: DC

## 2017-02-13 MED ORDER — ASPIRIN 81 MG PO CHEW
81.0000 mg | CHEWABLE_TABLET | Freq: Two times a day (BID) | ORAL | Status: DC
  Administered 2017-02-13 – 2017-02-14 (×2): 81 mg via ORAL
  Filled 2017-02-13 (×2): qty 1

## 2017-02-13 MED ORDER — HYDROCHLOROTHIAZIDE 25 MG PO TABS
25.0000 mg | ORAL_TABLET | Freq: Every morning | ORAL | Status: DC
  Administered 2017-02-14: 25 mg via ORAL
  Filled 2017-02-13: qty 1

## 2017-02-13 MED ORDER — ALBUTEROL SULFATE (2.5 MG/3ML) 0.083% IN NEBU: 2.5000 mg | INHALATION_SOLUTION | RESPIRATORY_TRACT | Status: DC | PRN

## 2017-02-13 MED ORDER — PHENOL 1.4 % MT LIQD: 1.0000 | OROMUCOSAL | Status: DC | PRN

## 2017-02-13 MED ORDER — PROPOFOL 10 MG/ML IV BOLUS
INTRAVENOUS | Status: AC
  Filled 2017-02-13: qty 40

## 2017-02-13 MED ORDER — PROMETHAZINE HCL 25 MG/ML IJ SOLN
6.2500 mg | INTRAMUSCULAR | Status: DC | PRN
  Administered 2017-02-13: 12.5 mg via INTRAVENOUS

## 2017-02-13 MED ORDER — DIPHENHYDRAMINE HCL 12.5 MG/5ML PO ELIX: 12.5000 mg | ORAL_SOLUTION | ORAL | Status: DC | PRN

## 2017-02-13 MED ORDER — METHOCARBAMOL 500 MG PO TABS
500.0000 mg | ORAL_TABLET | Freq: Four times a day (QID) | ORAL | Status: DC | PRN
  Administered 2017-02-14 (×2): 500 mg via ORAL
  Filled 2017-02-13 (×2): qty 1

## 2017-02-13 MED ORDER — CEFAZOLIN SODIUM-DEXTROSE 2-4 GM/100ML-% IV SOLN
2.0000 g | Freq: Four times a day (QID) | INTRAVENOUS | Status: AC
  Administered 2017-02-13 (×2): 2 g via INTRAVENOUS
  Filled 2017-02-13 (×2): qty 100

## 2017-02-13 MED ORDER — TRANEXAMIC ACID 1000 MG/10ML IV SOLN
1000.0000 mg | INTRAVENOUS | Status: AC
  Administered 2017-02-13: 1000 mg via INTRAVENOUS
  Filled 2017-02-13: qty 1100

## 2017-02-13 SURGICAL SUPPLY — 37 items
BAG ZIPLOCK 12X15 (MISCELLANEOUS) IMPLANT
BENZOIN TINCTURE PRP APPL 2/3 (GAUZE/BANDAGES/DRESSINGS) IMPLANT
BLADE SAW SGTL 18X1.27X75 (BLADE) ×2 IMPLANT
CAPT HIP TOTAL 2 ×2 IMPLANT
CELLS DAT CNTRL 66122 CELL SVR (MISCELLANEOUS) ×1 IMPLANT
COVER PERINEAL POST (MISCELLANEOUS) ×2 IMPLANT
COVER SURGICAL LIGHT HANDLE (MISCELLANEOUS) ×2 IMPLANT
DRAPE STERI IOBAN 125X83 (DRAPES) ×2 IMPLANT
DRAPE U-SHAPE 47X51 STRL (DRAPES) ×4 IMPLANT
DRSG AQUACEL AG ADV 3.5X10 (GAUZE/BANDAGES/DRESSINGS) ×2 IMPLANT
DURAPREP 26ML APPLICATOR (WOUND CARE) ×2 IMPLANT
ELECT REM PT RETURN 15FT ADLT (MISCELLANEOUS) ×2 IMPLANT
GAUZE XEROFORM 1X8 LF (GAUZE/BANDAGES/DRESSINGS) ×2 IMPLANT
GLOVE BIO SURGEON STRL SZ7.5 (GLOVE) ×2 IMPLANT
GLOVE BIOGEL PI IND STRL 7.5 (GLOVE) ×4 IMPLANT
GLOVE BIOGEL PI IND STRL 8 (GLOVE) ×2 IMPLANT
GLOVE BIOGEL PI INDICATOR 7.5 (GLOVE) ×4
GLOVE BIOGEL PI INDICATOR 8 (GLOVE) ×2
GLOVE ECLIPSE 8.0 STRL XLNG CF (GLOVE) ×2 IMPLANT
GOWN STRL REUS W/ TWL LRG LVL3 (GOWN DISPOSABLE) ×2 IMPLANT
GOWN STRL REUS W/TWL LRG LVL3 (GOWN DISPOSABLE) ×2
GOWN STRL REUS W/TWL XL LVL3 (GOWN DISPOSABLE) ×4 IMPLANT
HANDPIECE INTERPULSE COAX TIP (DISPOSABLE) ×1
HOLDER FOLEY CATH W/STRAP (MISCELLANEOUS) ×2 IMPLANT
PACK ANTERIOR HIP CUSTOM (KITS) ×2 IMPLANT
RTRCTR WOUND ALEXIS 18CM MED (MISCELLANEOUS) ×2
SET HNDPC FAN SPRY TIP SCT (DISPOSABLE) ×1 IMPLANT
STAPLER VISISTAT 35W (STAPLE) ×2 IMPLANT
STRIP CLOSURE SKIN 1/2X4 (GAUZE/BANDAGES/DRESSINGS) IMPLANT
SUT ETHIBOND NAB CT1 #1 30IN (SUTURE) ×2 IMPLANT
SUT MNCRL AB 4-0 PS2 18 (SUTURE) IMPLANT
SUT VIC AB 0 CT1 36 (SUTURE) ×2 IMPLANT
SUT VIC AB 1 CT1 36 (SUTURE) ×2 IMPLANT
SUT VIC AB 2-0 CT1 27 (SUTURE) ×2
SUT VIC AB 2-0 CT1 TAPERPNT 27 (SUTURE) ×2 IMPLANT
TRAY FOLEY W/METER SILVER 16FR (SET/KITS/TRAYS/PACK) ×2 IMPLANT
YANKAUER SUCT BULB TIP 10FT TU (MISCELLANEOUS) ×2 IMPLANT

## 2017-02-13 NOTE — Evaluation (Signed)
Physical Therapy Evaluation Patient Details Name: Donald SamsDwight E Shasteen MRN: [id number] DOB: [birth year] Today's Date: 02/13/2017   History of Present Illness  Pt s/p L THR and with hx of R THR in 2015  Clinical Impression  Pt s/p L THR and presents with decreased L LE strength/ROM, post op pain and obesity limiting functional mobility.  Pt should progress to dc home with family assist.    Follow Up Recommendations Home health PT;DC plan and follow up therapy as arranged by surgeon    Equipment Recommendations  None recommended by PT    Recommendations for Other Services OT consult     Precautions / Restrictions Precautions Precautions: Fall Restrictions Weight Bearing Restrictions: No Other Position/Activity Restrictions: WBAT      Mobility  Bed Mobility Overal bed mobility: Needs Assistance Bed Mobility: Supine to Sit     Supine to sit: Mod assist     General bed mobility comments: cues for sequence and use of R LE to self assist.  Physical assist to manage L LE and to bring trunk to upright.  During transition, pt experienced brief transient flash of pain and tearing sound - Dr Magnus IvanBlackman in to review with pt.  Transfers Overall transfer level: Needs assistance Equipment used: Rolling walker (2 wheeled) Transfers: Sit to/from Stand Sit to Stand: From elevated surface;Min assist;Mod assist         General transfer comment: cues for LE management and use of UEs to self assist  Ambulation/Gait Ambulation/Gait assistance: Min assist Ambulation Distance (Feet): 75 Feet Assistive device: Rolling walker (2 wheeled) Gait Pattern/deviations: Step-to pattern;Decreased step length - right;Decreased step length - left;Shuffle;Trunk flexed Gait velocity: decr Gait velocity interpretation: Below normal speed for age/gender General Gait Details: cues for sequence, posture and position from [employer]  Stairs            Wheelchair Mobility    Modified Rankin (Stroke Patients  Only)       Balance                                             Pertinent Vitals/Pain Pain Assessment: 0-10 Pain Score: 5  Pain Location: L hip Pain Descriptors / Indicators: Burning Pain Intervention(s): Limited activity within patient's tolerance;Monitored during session;Premedicated before session;Ice applied    Home Living Family/patient expects to be discharged to:: Private residence Living Arrangements: Spouse/significant other Available Help at Discharge: Family Type of Home: House Home Access: Stairs to enter Entrance Stairs-Rails: Doctor, general practiceight;Left Entrance Stairs-Number of Steps: 4 Home Layout: Able to live on main level with bedroom/bathroom;Two level Home Equipment: Walker - 2 wheels;Crutches;Cane - single point      Prior Function Level of Independence: Independent               Hand Dominance        Extremity/Trunk Assessment   Upper Extremity Assessment Upper Extremity Assessment: Overall WFL for tasks assessed    Lower Extremity Assessment Lower Extremity Assessment: LLE deficits/detail    Cervical / Trunk Assessment Cervical / Trunk Assessment: Normal  Communication   Communication: No difficulties  Cognition Arousal/Alertness: Awake/alert Behavior During Therapy: WFL for tasks assessed/performed Overall Cognitive Status: Within Functional Limits for tasks assessed  General Comments      Exercises Total Joint Exercises Ankle Circles/Pumps: AROM;Both;15 reps;Supine   Assessment/Plan    PT Assessment Patient needs continued PT services  PT Problem List Decreased strength;Decreased range of motion;Decreased activity tolerance;Decreased mobility;Decreased knowledge of use of DME;Pain;Obesity       PT Treatment Interventions DME instruction;Gait training;Stair training;Functional mobility training;Therapeutic activities;Therapeutic exercise;Patient/family education     PT Goals (Current goals can be found in the Care Plan section)  Acute Rehab PT Goals Patient Stated Goal: Regain IND  PT Goal Formulation: With patient Time For Goal Achievement: 02/17/17 Potential to Achieve Goals: Good    Frequency 7X/week   Barriers to discharge        Co-evaluation               AM-PAC PT "6 Clicks" Daily Activity  Outcome Measure Difficulty turning over in bed (including adjusting bedclothes, sheets and blankets)?: Unable Difficulty moving from lying on back to sitting on the side of the bed? : Unable Difficulty sitting down on and standing up from a chair with arms (e.g., wheelchair, bedside commode, etc,.)?: Unable Help needed moving to and from a bed to chair (including a wheelchair)?: A Little Help needed walking in hospital room?: A Little Help needed climbing 3-5 steps with a railing? : A Little 6 Click Score: 12    End of Session Equipment Utilized During Treatment: Gait belt Activity Tolerance: Patient tolerated treatment well Patient left: in chair;with call bell/phone within reach Nurse Communication: Mobility status PT Visit Diagnosis: Difficulty in walking, not elsewhere classified (R26.2)    Time: 7253-6644 PT Time Calculation (min) (ACUTE ONLY): 30 min   Charges:   PT Evaluation $PT Eval Low Complexity: 1 Low PT Treatments $Gait Training: 8-22 mins   PT G Codes:        Pg [phone]   Quamere Mussell 02/13/2017, 5:53 PM

## 2017-02-13 NOTE — Brief Op Note (Signed)
02/13/2017  8:52 AM  PATIENT:  Hortense Ramalwight E Musich  61 y.o. male  PRE-OPERATIVE DIAGNOSIS:  left hip osteoarthritis  POST-OPERATIVE DIAGNOSIS:  left hip osteoarthritis  PROCEDURE:  Procedure(s): LEFT TOTAL HIP ARTHROPLASTY ANTERIOR APPROACH (Left)  SURGEON:  Surgeon(s) and Role:    Kathryne Hitch* Danilynn Jemison Y, MD - Primary  PHYSICIAN ASSISTANT: Rexene EdisonGil Clark, PA-C  ANESTHESIA:   general  EBL:  550 mL   COUNTS:  YES  DICTATION: .Other Dictation: Dictation Number [id number]  PLAN OF CARE: Admit to inpatient   PATIENT DISPOSITION:  PACU - hemodynamically stable.   Delay start of Pharmacological VTE agent (>24hrs) due to surgical blood loss or risk of bleeding: no

## 2017-02-13 NOTE — Anesthesia Preprocedure Evaluation (Signed)
Anesthesia Evaluation  Patient identified by MRN, date of birth, ID band Patient awake    Reviewed: Allergy & Precautions, NPO status , Patient's Chart, lab work & pertinent test results  Airway Mallampati: II  TM Distance: >3 FB Neck ROM: Full    Dental no notable dental hx.    Pulmonary sleep apnea , former smoker,    Pulmonary exam normal breath sounds clear to auscultation       Cardiovascular hypertension, Normal cardiovascular exam Rhythm:Regular Rate:Normal     Neuro/Psych negative neurological ROS  negative psych ROS   GI/Hepatic negative GI ROS, Neg liver ROS,   Endo/Other  Morbid obesity  Renal/GU negative Renal ROS  negative genitourinary   Musculoskeletal negative musculoskeletal ROS (+)   Abdominal   Peds negative pediatric ROS (+)  Hematology negative hematology ROS (+)   Anesthesia Other Findings   Reproductive/Obstetrics negative OB ROS                             Anesthesia Physical Anesthesia Plan  ASA: III  Anesthesia Plan: General   Post-op Pain Management:    Induction: Intravenous  PONV Risk Score and Plan: 2 and Ondansetron and Dexamethasone  Airway Management Planned: Oral ETT  Additional Equipment:   Intra-op Plan:   Post-operative Plan: Extubation in OR  Informed Consent: I have reviewed the patients History and Physical, chart, labs and discussed the procedure including the risks, benefits and alternatives for the proposed anesthesia with the patient or authorized representative who has indicated his/her understanding and acceptance.   Dental advisory given  Plan Discussed with: CRNA and Surgeon  Anesthesia Plan Comments:         Anesthesia Quick Evaluation

## 2017-02-13 NOTE — Telephone Encounter (Signed)
Jenelle with [employer] [employer] [employer] called needing to confirm patient had surgery and also need diag, codes. The number to contact Carlyle BasquesJenelle is [phone]

## 2017-02-13 NOTE — Telephone Encounter (Signed)
It's his disability company.

## 2017-02-13 NOTE — H&P (Signed)
TOTAL HIP ADMISSION H&P  Patient is admitted for left total hip arthroplasty.  Subjective:  Chief Complaint: left hip pain  HPI: Donald [name], 60 y.o. male, has a history of pain and functional disability in the left hip(s) due to arthritis and patient has failed non-surgical conservative treatments for greater than 12 weeks to include NSAID's and/or analgesics, corticosteriod injections, flexibility and strengthening excercises, weight reduction as appropriate and activity modification.  Onset of symptoms was gradual starting 3 years ago with gradually worsening course since that time.The patient noted no past surgery on the left hip(s).  Patient currently rates pain in the left hip at 9 out of 10 with activity. Patient has night pain, worsening of pain with activity and weight bearing, pain that interfers with activities of daily living and pain with passive range of motion. Patient has evidence of subchondral sclerosis, periarticular osteophytes and joint space narrowing by imaging studies. This condition presents safety issues increasing the risk of falls.  There is no current active infection.  Patient Active Problem List   Diagnosis Date Noted  . Osteoarthritis of left hip 02/13/2017  . Primary osteoarthritis of right hip 03/10/2014  . Status post total replacement of right hip 03/10/2014  . Diastasis recti 02/21/2013   Past Medical History:  Diagnosis Date  . Arthritis   . Asthma   . Hyperlipidemia   . Hypertension   . Sleep apnea    cpap settings ? 10     Past Surgical History:  Procedure Laterality Date  . HERNIA REPAIR     x 2   . SINUS EXPLORATION    . TOTAL HIP ARTHROPLASTY Right 03/10/2014   Procedure: RIGHT TOTAL HIP ARTHROPLASTY ANTERIOR APPROACH;  Surgeon: Kathryne Hitch, MD;  Location: [facility];  Service: Orthopedics;  Laterality: Right;  . URETEROSCOPY     multiple times     Current Facility-Administered Medications  Medication Dose Route Frequency  Provider Last Rate Last Dose  . ceFAZolin (ANCEF) 3 g in dextrose 5 % 50 mL IVPB  3 g Intravenous On Call to OR Kathryne Hitch, MD      . chlorhexidine (HIBICLENS) 4 % liquid 4 application  60 mL Topical Once Kirtland Bouchard, PA-C      . lactated ringers infusion   Intravenous Continuous Mal Amabile, MD 100 mL/hr at 02/13/17 0604 1,000 mL at 02/13/17 0604  . tranexamic acid (CYKLOKAPRON) 1,000 mg in sodium chloride 0.9 % 100 mL IVPB  1,000 mg Intravenous To OR Kirtland Bouchard, PA-C       Allergies  Allergen Reactions  . Prednisone Swelling  . Tetracyclines & Related Nausea Only    Social History  Substance Use Topics  . Smoking status: Former Smoker    Packs/day: 3.00    Types: Cigarettes    Quit date: 28  . Smokeless tobacco: Never Used     Comment: patient quit smoking around 25  . Alcohol use 21.0 oz/week    21 Cans of beer, 14 Shots of liquor per week    History reviewed. No pertinent family history.   Review of Systems  Musculoskeletal: Positive for joint pain.  All other systems reviewed and are negative.   Objective:  Physical Exam  Constitutional: He is oriented to person, place, and time. He appears well-developed and well-nourished.  HENT:  Head: Normocephalic and atraumatic.  Eyes: Pupils are equal, round, and reactive to light. EOM are normal.  Neck: Normal range of motion. Neck supple.  Cardiovascular: Normal rate and regular rhythm.   Respiratory: Effort normal and breath sounds normal.  GI: Soft. Bowel sounds are normal.  Musculoskeletal:       Left hip: He exhibits decreased range of motion, decreased strength, tenderness and bony tenderness.  Neurological: He is alert and oriented to person, place, and time.  Skin: Skin is warm and dry.  Psychiatric: He has a normal mood and affect.    Vital signs in last 24 hours: Temp:  [99.1 F (37.3 C)] 99.1 F (37.3 C) (10/19 0543) Pulse Rate:  [74] 74 (10/19 0543) Resp:  [20] 20 (10/19  0543) BP: (135)/(81) 135/81 (10/19 0543) SpO2:  [97 %] 97 % (10/19 0543) Weight:  [288 lb (130.6 kg)] 288 lb (130.6 kg) (10/19 0600)  Labs:   Estimated body mass index is 42.53 kg/m as calculated from the following:   Height as of this encounter: 5\' 9"  (1.753 m).   Weight as of this encounter: 288 lb (130.6 kg).   Imaging Review Plain radiographs demonstrate severe degenerative joint disease of the left hip(s). The bone quality appears to be excellent for age and reported activity level.  Assessment/Plan:  End stage arthritis, left hip(s)  The patient history, physical examination, clinical judgement of the provider and imaging studies are consistent with end stage degenerative joint disease of the left hip(s) and total hip arthroplasty is deemed medically necessary. The treatment options including medical management, injection therapy, arthroscopy and arthroplasty were discussed at length. The risks and benefits of total hip arthroplasty were presented and reviewed. The risks due to aseptic loosening, infection, stiffness, dislocation/subluxation,  thromboembolic complications and other imponderables were discussed.  The patient acknowledged the explanation, agreed to proceed with the plan and consent was signed. Patient is being admitted for inpatient treatment for surgery, pain control, PT, OT, prophylactic antibiotics, VTE prophylaxis, progressive ambulation and ADL's and discharge planning.The patient is planning to be discharged home with home health services

## 2017-02-13 NOTE — Anesthesia Postprocedure Evaluation (Signed)
Anesthesia Post Note  Patient: Carney E Carothers  Procedure(s) Performed: LEFT TOTAL HIP ARTHROPLASTY ANTERIOR APPROACH (Left Hip)     Patient location during evaluation: PACU Anesthesia Type: General Level of consciousness: awake and alert Pain management: pain level controlled Vital Signs Assessment: post-procedure vital signs reviewed and stable Respiratory status: spontaneous breathing, nonlabored ventilation, respiratory function stable and patient connected to nasal cannula oxygen Cardiovascular status: blood pressure returned to baseline and stable Postop Assessment: no apparent nausea or vomiting Anesthetic complications: no    Last Vitals:  Vitals:   02/13/17 1000 02/13/17 1015  BP: 134/77 130/79  Pulse: 84 88  Resp: 15 13  Temp:    SpO2: 93% 97%    Last Pain:  Vitals:   02/13/17 1000  TempSrc:   PainSc: 6                  Jezabel Lecker S

## 2017-02-13 NOTE — Telephone Encounter (Signed)
Didn't know if maybe this belonged to you or maybe Amy B?

## 2017-02-13 NOTE — Anesthesia Procedure Notes (Signed)
Procedure Name: Intubation Date/Time: 02/13/2017 7:26 AM Performed by: Lind Covert Pre-anesthesia Checklist: Patient identified, Emergency Drugs available, Suction available, Patient being monitored and Timeout performed Patient Re-evaluated:Patient Re-evaluated prior to induction Oxygen Delivery Method: Circle system utilized Preoxygenation: Pre-oxygenation with 100% oxygen Induction Type: IV induction Laryngoscope Size: Mac and 4 Grade View: Grade I Tube type: Oral Number of attempts: 1 Airway Equipment and Method: Stylet Placement Confirmation: ETT inserted through vocal cords under direct vision,  positive ETCO2 and breath sounds checked- equal and bilateral Secured at: 23 cm Tube secured with: Tape Dental Injury: Teeth and Oropharynx as per pre-operative assessment

## 2017-02-13 NOTE — Progress Notes (Signed)
Oral airway removed upon arrival to pacu per CRNA.  Lt nare NT in place.

## 2017-02-13 NOTE — Transfer of Care (Signed)
Immediate Anesthesia Transfer of Care Note  Patient: Donald [name]  Procedure(s) Performed: LEFT TOTAL HIP ARTHROPLASTY ANTERIOR APPROACH (Left Hip)  Patient Location: PACU  Anesthesia Type:General  Level of Consciousness: sedated  Airway & Oxygen Therapy: Patient Spontanous Breathing and Patient connected to face mask oxygen  Post-op Assessment: Report given to RN and Post -op Vital signs reviewed and stable  Post vital signs: Reviewed and stable  Last Vitals:  Vitals:   02/13/17 0543  BP: 135/81  Pulse: 74  Resp: 20  Temp: 37.3 C  SpO2: 97%    Last Pain:  Vitals:   02/13/17 0543  TempSrc: Oral      Patients Stated Pain Goal: 4 (02/13/17 0600)  Complications: No apparent anesthesia complications

## 2017-02-14 LAB — CBC
HEMATOCRIT: 35 % — AB (ref 39.0–52.0)
Hemoglobin: 11.6 g/dL — ABNORMAL LOW (ref 13.0–17.0)
MCH: 32.6 pg (ref 26.0–34.0)
MCHC: 33.1 g/dL (ref 30.0–36.0)
MCV: 98.3 fL (ref 78.0–100.0)
PLATELETS: 176 10*3/uL (ref 150–400)
RBC: 3.56 MIL/uL — ABNORMAL LOW (ref 4.22–5.81)
RDW: 13.3 % (ref 11.5–15.5)
WBC: 9.5 10*3/uL (ref 4.0–10.5)

## 2017-02-14 LAB — BASIC METABOLIC PANEL
ANION GAP: 8 (ref 5–15)
BUN: 12 mg/dL (ref 6–20)
CALCIUM: 9.5 mg/dL (ref 8.9–10.3)
CO2: 28 mmol/L (ref 22–32)
CREATININE: 0.8 mg/dL (ref 0.61–1.24)
Chloride: 99 mmol/L — ABNORMAL LOW (ref 101–111)
GFR calc Af Amer: >60 mL/min (ref >60)
GLUCOSE: 130 mg/dL — AB (ref 65–99)
Potassium: 4.1 mmol/L (ref 3.5–5.1)
Sodium: 135 mmol/L (ref 135–145)

## 2017-02-14 MED ORDER — OXYCODONE-ACETAMINOPHEN 5-325 MG PO TABS: 1.0000 | ORAL_TABLET | ORAL | 0 refills | Status: DC | PRN

## 2017-02-14 MED ORDER — METHOCARBAMOL 500 MG PO TABS: 500.0000 mg | ORAL_TABLET | Freq: Four times a day (QID) | ORAL | 0 refills | Status: DC | PRN

## 2017-02-14 MED ORDER — ASPIRIN 81 MG PO CHEW: 81.0000 mg | CHEWABLE_TABLET | Freq: Two times a day (BID) | ORAL | 0 refills | Status: DC

## 2017-02-14 NOTE — Evaluation (Signed)
Occupational Therapy Evaluation Patient Details Name: Donald [name] MRN: [id number] DOB: April 28, 1956 Today's Date: 02/14/2017    History of Present Illness Pt s/p L THR and with hx of R THR in 2015   Clinical Impression   Pt up to comfort height commode with walker with min assist. Discussed option of 3in1 as he doesn't have a vanity or grab bar to help him with rising and descending to commode. He states he has a 3in1 from previous surgery but he couldn't use it as it was uncomfortable with his body habitus. He would like to consider a wide 3in1 depending on cost. Will continue to follow on acute to progress ADL independence for return home.    Follow Up Recommendations  No OT follow up;Supervision/Assistance - 24 hour    Equipment Recommendations  3 in 1 bedside commode (pt would like to consider a wide 3in1 depending on cost. )    Recommendations for Other Services       Precautions / Restrictions Precautions Precautions: Fall Restrictions Weight Bearing Restrictions: No Other Position/Activity Restrictions: WBAT      Mobility Bed Mobility               General bed mobility comments: pt in chair.   Transfers Overall transfer level: Needs assistance Equipment used: Rolling walker (2 wheeled) Transfers: Sit to/from Stand Sit to Stand: Min assist         General transfer comment: verbal cues for hand placement and LE management.    Balance                                           ADL either performed or assessed with clinical judgement   ADL Overall ADL's : Needs assistance/impaired Eating/Feeding: Independent;Sitting   Grooming: Set up;Sitting   Upper Body Bathing: Set up;Sitting   Lower Body Bathing: Moderate assistance;Sit to/from stand   Upper Body Dressing : Set up;Sitting   Lower Body Dressing: Moderate assistance;Sit to/from stand   Toilet Transfer: Minimal assistance;Ambulation;Comfort height toilet;RW;Grab bars    Toileting- Clothing Manipulation and Hygiene: Minimal assistance;Sit to/from stand         General ADL Comments: Educated pt and family on AE options available. Pt has a reacher and shoe horn but states he doesnt recall how to use AE from previous surgery. Demonstrated all AE for pt and family. Discussed obtaining sock aid and long sponge as those are the two pieces he doesnt own. He states he will consider these pieces. Also discussed his 3in1-he prefers a wider 3in1 as he states it was not comfortable to sit on regular 3in1 with the last surgery.      Vision Patient Visual Report: No change from baseline       Perception     Praxis      Pertinent Vitals/Pain Pain Assessment: 0-10 Pain Score: 9  Pain Location: L hip-briefly at 9 Pain Descriptors / Indicators: Aching Pain Intervention(s): Monitored during session;Ice applied     Hand Dominance     Extremity/Trunk Assessment Upper Extremity Assessment Upper Extremity Assessment: Overall WFL for tasks assessed           Communication Communication Communication: No difficulties   Cognition Arousal/Alertness: Awake/alert Behavior During Therapy: WFL for tasks assessed/performed Overall Cognitive Status: Within Functional Limits for tasks assessed  General Comments       Exercises     Shoulder Instructions      Home Living Family/patient expects to be discharged to:: Private residence Living Arrangements: Spouse/significant other Available Help at Discharge: Family Type of Home: House Home Access: Stairs to enter [employer] of Steps: 4 Entrance Stairs-Rails: Right;Left Home Layout: Able to live on main level with bedroom/bathroom;Two level Alternate Level Stairs-Number of Steps: 14 Alternate Level Stairs-Rails: Right Bathroom Shower/Tub: Tub/shower unit   Bathroom Toilet: Handicapped height     Home Equipment: Environmental consultant - 2 wheels;Crutches;Cane  - single point;Bedside commode;Hand held shower head;Shower seat;Adaptive equipment Adaptive Equipment: Reacher;Long-handled shoe horn        Prior Functioning/Environment Level of Independence: Independent        Comments: although struggled with LB dressing.        OT Problem List: Decreased strength;Decreased knowledge of use of DME or AE      OT Treatment/Interventions: Self-care/ADL training;Patient/family education;DME and/or AE instruction;Therapeutic activities    OT Goals(Current goals can be found in the care plan section) Acute Rehab OT Goals Patient Stated Goal: return to independence.  OT Goal Formulation: With patient/family Time For Goal Achievement: 02/21/17 Potential to Achieve Goals: Good  OT Frequency: Min 2X/week   Barriers to D/C:            Co-evaluation              AM-PAC PT "6 Clicks" Daily Activity     Outcome Measure Help from another person eating meals?: None Help from another person taking care of personal grooming?: None Help from another person toileting, which includes using toliet, bedpan, or urinal?: A Little Help from another person bathing (including washing, rinsing, drying)?: A Lot Help from another person to put on and taking off regular upper body clothing?: A Little Help from another person to put on and taking off regular lower body clothing?: A Lot 6 Click Score: 18   End of Session Equipment Utilized During Treatment: Rolling walker  Activity Tolerance: Patient tolerated treatment well Patient left: in chair;with call bell/phone within reach;with family/visitor present  OT Visit Diagnosis: Unsteadiness on feet (R26.81);Muscle weakness (generalized) (M62.81)                Time: 1610-9604 OT Time Calculation (min): 30 min Charges:  OT General Charges $OT Visit: 1 Visit OT Evaluation $OT Eval Low Complexity: 1 Low OT Treatments $Therapeutic Activity: 8-22 mins G-Codes:       Donald [name] Donald [name] 02/14/2017,  1:37 PM

## 2017-02-14 NOTE — Progress Notes (Signed)
Physical Therapy Treatment Patient Details Name: Donald [name] MRN: [id number] DOB: 1956-04-14 Today's Date: 02/14/2017    History of Present Illness Pt s/p L THR and with hx of R THR in 2015    PT Comments    Pt continues cooperative and progressing steadily with mobility including negotiating stairs.  Reviewed car transfers with pt and spouse.   Follow Up Recommendations  Home health PT;DC plan and follow up therapy as arranged by surgeon     Equipment Recommendations  None recommended by PT    Recommendations for Other Services OT consult     Precautions / Restrictions Precautions Precautions: Fall Restrictions Weight Bearing Restrictions: No Other Position/Activity Restrictions: WBAT    Mobility  Bed Mobility               General bed mobility comments: Pt in chair and plans to stay in recliner initially  Transfers Overall transfer level: Needs assistance Equipment used: Rolling walker (2 wheeled) Transfers: Sit to/from Stand Sit to Stand: Min guard         General transfer comment: verbal cues for hand placement and LE management.  Ambulation/Gait Ambulation/Gait assistance: Min guard Ambulation Distance (Feet): 55 Feet Assistive device: Rolling walker (2 wheeled) Gait Pattern/deviations: Step-to pattern;Decreased step length - right;Decreased step length - left;Shuffle;Trunk flexed Gait velocity: decr Gait velocity interpretation: Below normal speed for age/gender General Gait Details: cues for sequence, posture and position from RW   Stairs Stairs: Yes   Stair Management: One rail Right;Step to pattern;Forwards;With crutches Number of Stairs: 4 General stair comments: cues for sequence and foot/crutch placement.  Spouse present and written instruction provided  Wheelchair Mobility    Modified Rankin (Stroke Patients Only)       Balance                                            Cognition Arousal/Alertness:  Awake/alert Behavior During Therapy: WFL for tasks assessed/performed Overall Cognitive Status: Within Functional Limits for tasks assessed                                        Exercises Total Joint Exercises Ankle Circles/Pumps: AROM;Both;15 reps;Supine Quad Sets: AROM;Both;10 reps;Supine Heel Slides: AAROM;Left;20 reps;Supine Hip ABduction/ADduction: AAROM;Left;15 reps;Supine    General Comments        Pertinent Vitals/Pain Pain Assessment: 0-10 Pain Score: 5  Pain Location: L hip Pain Descriptors / Indicators: Aching;Sore Pain Intervention(s): Limited activity within patient's tolerance;Monitored during session;Premedicated before session;Ice applied    Home Living Family/patient expects to be discharged to:: Private residence Living Arrangements: Spouse/significant other Available Help at Discharge: Family Type of Home: House Home Access: Stairs to enter Entrance Stairs-Rails: Right;Left Home Layout: Able to live on main level with bedroom/bathroom;Two level Home Equipment: Walker - 2 wheels;Crutches;Cane - single point;Bedside commode;Hand held shower head;Shower seat;Adaptive equipment      Prior Function Level of Independence: Independent      Comments: although struggled with LB dressing.   PT Goals (current goals can now be found in the care plan section) Acute Rehab PT Goals Patient Stated Goal: return to independence.  PT Goal Formulation: With patient Time For Goal Achievement: 02/17/17 Potential to Achieve Goals: Good Progress towards PT goals: Progressing toward goals    Frequency  7X/week      PT Plan Current plan remains appropriate    Co-evaluation              AM-PAC PT "6 Clicks" Daily Activity  Outcome Measure  Difficulty turning over in bed (including adjusting bedclothes, sheets and blankets)?: Unable Difficulty moving from lying on back to sitting on the side of the bed? : Unable Difficulty sitting down  on and standing up from a chair with arms (e.g., wheelchair, bedside commode, etc,.)?: Unable Help needed moving to and from a bed to chair (including a wheelchair)?: A Little Help needed walking in hospital room?: A Little Help needed climbing 3-5 steps with a railing? : A Little 6 Click Score: 12    End of Session Equipment Utilized During Treatment: Gait belt Activity Tolerance: Patient tolerated treatment well Patient left: in chair;with call bell/phone within reach Nurse Communication: Mobility status PT Visit Diagnosis: Difficulty in walking, not elsewhere classified (R26.2)     Time: 0981-1914 PT Time Calculation (min) (ACUTE ONLY): 29 min  Charges:  $Gait Training: 8-22 mins $Therapeutic Exercise: 8-22 mins $Therapeutic Activity: 8-22 mins                    G Codes:       Pg [phone]    Donald [name] 02/14/2017, 2:56 PM

## 2017-02-14 NOTE — Care Management Note (Signed)
Case Management Note  Patient Details  Name: Donald SamsDwight E Lyman MRN: [id number] Date of Birth: Jan 05, 1956  Subjective/Objective:    Left THA                Action/Plan: Discharge Planning: NCM spoke to pt and states he had [facility] in the past. Agreeable to have their services again. Pt reports he has RW and bedside commode at home. Wife at home to assist with care.   PCP Farris HasMORROW, AARON MD  Expected Discharge Date:  02/15/2017            Expected Discharge Plan:  Home w Home Health Services  In-House Referral:  NA  Discharge planning Services  CM Consult  Post Acute Care Choice:  Home Health Choice offered to:  Patient  DME Arranged:  N/A DME Agency:  NA  HH Arranged:  PT HH Agency[facility]  [facility]  Status of Service:  Completed, signed off  If discussed at [employer] Length of Stay Meetings, dates discussed:    Additional Comments:  Elliot CousinShavis, Lastacia Solum Ellen, RN 02/14/2017, 11:41 AM

## 2017-02-14 NOTE — Progress Notes (Signed)
Patient ID: Donald SamsDwight E [name], male   DOB: [birth year], 61 y.o.   MRN: [id number] Looks good overall.  Can be discharged to home later today.

## 2017-02-14 NOTE — Op Note (Signed)
NAMEBRADDOCK, Donald [name] NO.:  [id number]  MEDICAL RECORD NO.:  [id number]  LOCATION:  [facility]                         FACILITY:  [facility]  PHYSICIAN:  Donald [name]. Magnus Ivan, M.D.DATE OF BIRTH:  [birth year]  DATE OF PROCEDURE:  02/13/2017 DATE OF DISCHARGE:                              OPERATIVE REPORT   PREOPERATIVE DIAGNOSES:  Primary osteoarthritis and degenerative joint disease, left hip.  POSTOPERATIVE DIAGNOSES:  Primary osteoarthritis and degenerative joint disease, left hip.  PROCEDURE:  Left total hip arthroplasty with direct anterior approach.  IMPLANTS:  DePuy Sector Gription acetabular component size 54 with a single screw, size 36 +4 polyethylene liner, size 12 Corail femoral component with varus offset, size 36 +1.5 ceramic hip ball.  SURGEON:  Donald [name]. Magnus Ivan, MD.  ASSISTANT:  Donald Canal, PA-C.  ANESTHESIA:  General.  ANTIBIOTICS:  3 g IV Ancef.  BLOOD LOSS:  550 mL.  COMPLICATIONS:  None.  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  He underwent a successful right total hip arthroplasty 3 years ago, and now presents to have his left hip replaced.  His pain is daily and has detrimentally affected his activities of daily living, his quality of life and his mobility.  His x-rays confirmed severe arthritis in his left hip.  At this point, he understands the risk of acute blood loss anemia, nerve and vessel injury, fracture, infection, dislocation, DVT.  He understands our goals are to decrease pain, improve mobility, and overall improved quality of life.  PROCEDURE DESCRIPTION:  After informed consent was obtained, appropriate left hip was marked.  He was brought to the operating room where general anesthesia obtained while he was on the stretcher.  A Foley catheter was placed and traction boots were placed on both  his feet.  Next, he was placed supine on the Hana fracture table with the perineal post in place and both legs inline to skeletal traction devices, but no traction applied.  His left operative hip was prepped and draped with DuraPrep and sterile drapes.  A time-out was called and he was identified as correct patient and correct left hip.  We then made an incision just inferior and posterior to the anterior superior iliac spine and carried this obliquely down the leg.  We dissected down the tensor fascia lata muscle.  The tensor fascia was then divided longitudinally to proceed with a direct anterior approach to the hip.  We identified and cauterized circumflex vessels and identified the hip capsule.  I opened up the hip capsule in an L-type format, finding a large joint effusion and significant osteoarthritis in his hip.  We placed Cobra retractors on the medial and lateral femoral neck and then made our femoral neck cut with an oscillating saw and completed this on osteotome.  We placed a corkscrew guide in the femoral head and removed the femoral head in its entirety and found to be devoid of cartilage.  We then cleaned the acetabular remnants of acetabular labrum and other debris.  We then began reaming from a size 42 reamer going in a stepwise increments up to  a size 54 with all reamers under direct visualization, the last reamer under direct fluoroscopy so we could obtain our depth of reaming, our inclination, and anteversion.  Once we were pleased with this, we placed the real DePuy Sector Gription acetabular component size 54 and a single screw.  We then placed a 36 +4 polyethylene liner for that sized acetabular component.  Attention was then turned to the femur.  With the leg externally rotated to 120 degrees extended and adducted, we were able to place a Mueller retractor medially and Hohmann retractor behind the greater trochanter.  We released the lateral joint capsule and  used a box cutting osteotome to enter the femoral [name] and a rongeur to lateralize.  We then began broaching from a size 8 broach using Corail broaching system going up to a size 12.  With a size 12, we trialed a varus offset femoral neck and a 36 +1.5 hip ball.  We brought the leg back over and up with traction and rotation reducing the pelvis.  We were pleased with leg length, offset, range of motion, and stability. We then dislocated the hip and removed the trial components.  We were able to place the real Corail femoral component size 12 with varus offset and the real size 36 +1.5 ceramic hip ball, reduced this in the acetabulum.  Again, we were pleased with stability.  We then irrigated the soft tissue with normal saline solution using pulsatile lavage.  We closed the joint capsule with interrupted #1 Ethibond suture followed by running #1 Vicryl in the tensor fascia, 0 Vicryl in the deep tissue, 2-0 Vicryl in the subcutaneous tissue, interrupted staples on the skin. Xeroform and Aquacel dressing were applied.  He was taken awakened, extubated, and taken to the recovery room in stable condition.  All final counts were correct.  There were no complications noted.  Of note, Donald CanalGilbert Clark, PA-C, assisted in entire case.  His assistance was crucial for facilitating all aspects of this case.     Donald Pandahristopher Y. Magnus IvanBlackman, M.D.     CYB/MEDQ  D:  02/13/2017  T:  02/14/2017  Job:  [id number]

## 2017-02-14 NOTE — Discharge Summary (Signed)
Patient ID: DARWIN ROTHLISBERGER MRN: [id number] DOB/AGE: 61/12/1955 61 y.o.  Admit date: 02/13/2017 Discharge date: 02/14/2017  Admission Diagnoses:  Principal Problem:   Osteoarthritis of left hip Active Problems:   Status post total replacement of left hip   Discharge Diagnoses:  Same  Past Medical History:  Diagnosis Date  . Arthritis   . Asthma   . Hyperlipidemia   . Hypertension   . Sleep apnea    cpap settings ? 10     Surgeries: Procedure(s): LEFT TOTAL HIP ARTHROPLASTY ANTERIOR APPROACH on 02/13/2017   Consultants:   Discharged Condition: Improved  Hospital Course: ERASMUS BISTLINE is an 61 y.o. male who was admitted 02/13/2017 for operative treatment ofOsteoarthritis of left hip. Patient has severe unremitting pain that affects sleep, daily activities, and work/hobbies. After pre-op clearance the patient was taken to the operating room on 02/13/2017 and underwent  Procedure(s): LEFT TOTAL HIP ARTHROPLASTY ANTERIOR APPROACH.    Patient was given perioperative antibiotics: Anti-infectives    Start     Dose/Rate Route Frequency Ordered Stop   02/13/17 1400  ceFAZolin (ANCEF) IVPB 2g/100 mL premix     2 g 200 mL/hr over 30 Minutes Intravenous Every 6 hours 02/13/17 1053 02/13/17 2038   02/13/17 0600  ceFAZolin (ANCEF) 3 g in dextrose 5 % 50 mL IVPB     3 g 130 mL/hr over 30 Minutes Intravenous On call to O.R. 02/12/17 1451 02/13/17 0727       Patient was given sequential compression devices, early ambulation, and chemoprophylaxis to prevent DVT.  Patient benefited maximally from hospital stay and there were no complications.    Recent vital signs: Patient Vitals for the past 24 hrs:  BP Temp Temp src Pulse Resp SpO2  02/14/17 1041 124/71 99.1 F (37.3 C) Oral 82 17 93 %  02/14/17 0615 138/73 97.9 F (36.6 C) Oral 82 16 95 %  02/14/17 0241 137/74 99.5 F (37.5 C) Oral 81 17 95 %  02/13/17 2203 (!) 144/77 (!) 97.5 F (36.4 C) Oral 70 17 99 %  02/13/17 1848  (!) 146/75 99 F (37.2 C) Oral 79 17 99 %  02/13/17 1345 123/75 98.9 F (37.2 C) Oral 79 18 95 %     Recent laboratory studies:  Recent Labs  02/14/17 0608  WBC 9.5  HGB 11.6*  HCT 35.0*  PLT 176  NA 135  K 4.1  CL 99*  CO2 28  BUN 12  CREATININE 0.80  GLUCOSE 130*  CALCIUM 9.5     Discharge Medications:   Allergies as of 02/14/2017      Reactions   Prednisone Swelling   Tetracyclines & Related Nausea Only      Medication List    STOP taking these medications   glucosamine-chondroitin 500-400 MG tablet   rivaroxaban 10 MG Tabs tablet Commonly known as:  XARELTO     TAKE these medications   acetaminophen 500 MG tablet Commonly known as:  TYLENOL Take 500-1,000 mg by mouth every 6 (six) hours as needed for moderate pain or headache.   amLODipine 10 MG tablet Commonly known as:  NORVASC Take 10 mg by mouth every morning.   aspirin 81 MG chewable tablet Chew 1 tablet (81 mg total) by mouth 2 (two) times daily.   clobetasol cream 0.05 % Commonly known as:  TEMOVATE Apply 1 application topically daily.   hydrochlorothiazide 25 MG tablet Commonly known as:  HYDRODIURIL Take 25 mg by mouth every morning.   ibuprofen  200 MG tablet Commonly known as:  ADVIL,MOTRIN Take 200-400 mg by mouth every 6 (six) hours as needed for headache or moderate pain.   lisinopril 40 MG tablet Commonly known as:  PRINIVIL,ZESTRIL Take 40 mg by mouth every morning.   methocarbamol 500 MG tablet Commonly known as:  ROBAXIN Take 1 tablet (500 mg total) by mouth every 6 (six) hours as needed for muscle spasms.   OCUVITE PO Take 1 capsule by mouth 4 (four) times a week.   oxyCODONE-acetaminophen 5-325 MG tablet Commonly known as:  ROXICET Take 1-2 tablets by mouth every 4 (four) hours as needed.   POTASSIUM PO Take 2 tablets by mouth daily as needed (for heart racing/cramping).   PROAIR HFA 108 (90 Base) MCG/ACT inhaler Generic drug:  albuterol Inhale 1-2 puffs into  the lungs every 4 (four) hours as needed for wheezing or shortness of breath.   sodium chloride 0.65 % Soln nasal spray Commonly known as:  OCEAN Place 2-3 sprays into both nostrils daily as needed for congestion.   TURMERIC PO Take 1 capsule by mouth 2 (two) times a week.   VECTICAL 3 MCG/GM cream Generic drug:  Calcitriol Apply 1 application topically daily as needed (for psoriasis).            Durable Medical Equipment        Start     Ordered   02/13/17 1054  DME Walker rolling  Once    Question:  Patient needs a walker to treat with the following condition  Answer:  Status post total replacement of left hip   02/13/17 1053   02/13/17 1054  DME 3 n 1  Once     02/13/17 1053      Diagnostic Studies: Dg Pelvis Portable  Result Date: 02/13/2017 CLINICAL DATA:  Post LEFT hip replacement EXAM: PORTABLE PELVIS 1-2 VIEWS COMPARISON:  Portable exam 0913 hours compared to earlier intraoperative images of 02/13/2017 FINDINGS: BILATERAL hip prostheses identified, new on LEFT since preoperative exam. Bones demineralized. No fracture or dislocation. IMPRESSION: BILATERAL hip prostheses without acute complication. Electronically Signed   By: Ulyses Southward M.D.   On: 02/13/2017 09:38   Dg C-arm 1-60 Min-no Report  Result Date: 02/13/2017 Fluoroscopy was utilized by the requesting physician.  No radiographic interpretation.   Dg Hip Operative Unilat W Or W/o Pelvis Left  Result Date: 02/13/2017 CLINICAL DATA:  Left-sided total-hip replacement EXAM: OPERATIVE LEFT HIP (WITH PELVIS IF PERFORMED) 1 VIEW TECHNIQUE: Fluoroscopic spot image(s) were submitted for interpretation post-operatively. COMPARISON:  Pelvis and left hip radiographs January 14, 2017 FLUOROSCOPY TIME:  0 minutes 33 seconds; 4 acquired images FINDINGS: There is evidence of prior total hip replacement right with visualized prosthetic components well-seated on the right. On the left, there is a total hip replacement with  prosthetic components appearing well-seated on frontal view. No fracture or dislocation.  No erosive change. IMPRESSION: Total hip replacement on the left with prosthetic components well-seated. Prior total hip replacement on the right with visualized prosthetic components well-seated. No acute fracture or dislocation evident on frontal view. Electronically Signed   By: Bretta Bang III M.D.   On: 02/13/2017 09:00    Disposition: 06-Home-Health Care Svc  Discharge Instructions    Discharge patient    Complete by:  As directed    Discharge disposition:  01-Home or Self Care   Discharge patient date:  02/14/2017      Follow-up Information    [facility] Follow up.  Specialty:  Home Health Services Why:  Home Health Physical Therapy- agency will call to arrange initial visit Contact information: [address] [place] [address] [place] [address] [phone]        Kathryne HitchBlackman, Christopher Y, MD Follow up in 2 week(s).   Specialty:  Orthopedic Surgery Contact information: [address] [place] [address] [phone]            Signed: Kathryne HitchChristopher Y Blackman 02/14/2017, 1:35 PM

## 2017-02-14 NOTE — Progress Notes (Signed)
   Subjective: 1 Day Post-Op Procedure(s) (LRB): LEFT TOTAL HIP ARTHROPLASTY ANTERIOR APPROACH (Left) Patient reports pain as mild.    Objective: Vital signs in last 24 hours: Temp:  [97.5 F (36.4 C)-99.5 F (37.5 C)] 97.9 F (36.6 C) (10/20 0615) Pulse Rate:  [70-88] 82 (10/20 0615) Resp:  [13-18] 16 (10/20 0615) BP: (123-147)/(68-84) 138/73 (10/20 0615) SpO2:  [93 %-99 %] 95 % (10/20 0615) Weight:  [288 lb (130.6 kg)] 288 lb (130.6 kg) (10/19 1030)  Intake/Output from previous day: 10/19 0701 - 10/20 0700 In: 2791.3 [P.O.:1500; I.V.:1136.3; IV Piggyback:155] Out: 2575 [Urine:2025; Blood:550] Intake/Output this shift: Total I/O In: 360 [P.O.:360] Out: 300 [Urine:300]   Recent Labs  02/14/17 0608  HGB 11.6*    Recent Labs  02/14/17 0608  WBC 9.5  RBC 3.56*  HCT 35.0*  PLT 176    Recent Labs  02/14/17 0608  NA 135  K 4.1  CL 99*  CO2 28  BUN 12  CREATININE 0.80  GLUCOSE 130*  CALCIUM 9.5   No results for input(s): LABPT, INR in the last 72 hours.  Neurologically intact No results found.  Assessment/Plan: 1 Day Post-Op Procedure(s) (LRB): LEFT TOTAL HIP ARTHROPLASTY ANTERIOR APPROACH (Left) Up with therapy home late today or tomorrow.   Eldred MangesMark C Sotirios Navarro 02/14/2017, 9:33 AM

## 2017-02-14 NOTE — Discharge Instructions (Signed)

## 2017-02-14 NOTE — Progress Notes (Signed)
Discharged from floor via w/c for transport home by car. Family & belongings with pt. No changes in assessment. Dorothy Polhemus  

## 2017-02-14 NOTE — Progress Notes (Addendum)
Physical Therapy Treatment Patient Details Name: Donald [name] MRN: [id number] DOB: March 08, 1956 Today's Date: 02/14/2017    History of Present Illness Pt s/p L THR and with hx of R THR in 2015    PT Comments    Pt cooperative and progressing steadily with mobility.  Pt hopeful for dc home this date.   Follow Up Recommendations  Home health PT;DC plan and follow up therapy as arranged by surgeon     Equipment Recommendations  None recommended by PT    Recommendations for Other Services OT consult     Precautions / Restrictions Precautions Precautions: Fall Restrictions Weight Bearing Restrictions: No Other Position/Activity Restrictions: WBAT    Mobility  Bed Mobility               General bed mobility comments: Pt in chair and plans to stay in recliner initially  Transfers Overall transfer level: Needs assistance Equipment used: Rolling walker (2 wheeled) Transfers: Sit to/from Stand Sit to Stand: Min guard         General transfer comment: verbal cues for hand placement and LE management.  Ambulation/Gait Ambulation/Gait assistance: Min assist;Min guard Ambulation Distance (Feet): 75 Feet Assistive device: Rolling walker (2 wheeled) Gait Pattern/deviations: Step-to pattern;Decreased step length - right;Decreased step length - left;Shuffle;Trunk flexed Gait velocity: decr Gait velocity interpretation: Below normal speed for age/gender General Gait Details: cues for sequence, posture and position from RW        Wheelchair Mobility    Modified Rankin (Stroke Patients Only)       Balance                                            Cognition Arousal/Alertness: Awake/alert Behavior During Therapy: WFL for tasks assessed/performed Overall Cognitive Status: Within Functional Limits for tasks assessed                                        Exercises Total Joint Exercises Ankle Circles/Pumps: AROM;Both;15  reps;Supine Quad Sets: AROM;Both;10 reps;Supine Heel Slides: AAROM;Left;20 reps;Supine Hip ABduction/ADduction: AAROM;Left;15 reps;Supine    General Comments        Pertinent Vitals/Pain Pain Assessment: 0-10 Pain Score: 5  Pain Location: L hip-briefly at 9 Pain Descriptors / Indicators: Aching;Sore Pain Intervention(s): Limited activity within patient's tolerance;Monitored during session;Premedicated before session;Ice applied    Home Living Family/patient expects to be discharged to:: Private residence Living Arrangements: Spouse/significant other Available Help at Discharge: Family Type of Home: House Home Access: Stairs to enter Entrance Stairs-Rails: Right;Left Home Layout: Able to live on main level with bedroom/bathroom;Two level Home Equipment: Walker - 2 wheels;Crutches;Cane - single point;Bedside commode;Hand held shower head;Shower seat;Adaptive equipment      Prior Function Level of Independence: Independent      Comments: although struggled with LB dressing.   PT Goals (current goals can now be found in the care plan section) Acute Rehab PT Goals Patient Stated Goal: return to independence.  PT Goal Formulation: With patient Time For Goal Achievement: 02/17/17 Potential to Achieve Goals: Good Progress towards PT goals: Progressing toward goals    Frequency    7X/week      PT Plan Current plan remains appropriate    Co-evaluation  AM-PAC PT "6 Clicks" Daily Activity  Outcome Measure  Difficulty turning over in bed (including adjusting bedclothes, sheets and blankets)?: Unable Difficulty moving from lying on back to sitting on the side of the bed? : Unable Difficulty sitting down on and standing up from a chair with arms (e.g., wheelchair, bedside commode, etc,.)?: Unable Help needed moving to and from a bed to chair (including a wheelchair)?: A Little Help needed walking in hospital room?: A Little Help needed climbing 3-5 steps  with a railing? : A Little 6 Click Score: 12    End of Session Equipment Utilized During Treatment: Gait belt Activity Tolerance: Patient tolerated treatment well Patient left: in chair;with call bell/phone within reach Nurse Communication: Mobility status PT Visit Diagnosis: Difficulty in walking, not elsewhere classified (R26.2)     Time: 9528-4132 PT Time Calculation (min) (ACUTE ONLY): 26 min  Charges:  $Gait Training: 8-22 mins $Therapeutic Exercise: 8-22 mins                    G Codes:       Pg [phone]    Alianny Toelle 02/14/2017, 2:50 PM

## 2017-02-17 NOTE — Telephone Encounter (Signed)
information given

## 2017-02-23 ENCOUNTER — Telehealth (INDEPENDENT_AMBULATORY_CARE_PROVIDER_SITE_OTHER): Payer: Self-pay | Admitting: Orthopaedic Surgery

## 2017-02-23 ENCOUNTER — Other Ambulatory Visit (INDEPENDENT_AMBULATORY_CARE_PROVIDER_SITE_OTHER): Payer: Self-pay | Admitting: Orthopaedic Surgery

## 2017-02-23 MED ORDER — OXYCODONE-ACETAMINOPHEN 5-325 MG PO TABS: 1.0000 | ORAL_TABLET | Freq: Four times a day (QID) | ORAL | 0 refills | Status: DC | PRN

## 2017-02-23 NOTE — Telephone Encounter (Signed)
Patient called stating he needing a refill for his pain med Oxycodone for pain. Wants someone to call him back

## 2017-02-23 NOTE — Telephone Encounter (Signed)
Can come and pick up a script 

## 2017-02-23 NOTE — Telephone Encounter (Signed)
Please advise 

## 2017-02-24 NOTE — Telephone Encounter (Signed)
Patient aware Rx ready at front desk  

## 2017-02-26 ENCOUNTER — Ambulatory Visit (INDEPENDENT_AMBULATORY_CARE_PROVIDER_SITE_OTHER): Payer: BLUE CROSS/BLUE SHIELD | Admitting: Orthopaedic Surgery

## 2017-02-26 DIAGNOSIS — Z96642 Presence of left artificial hip joint: Secondary | ICD-10-CM

## 2017-02-26 NOTE — Progress Notes (Signed)
The patient is 2 weeks tomorrow status post a left total hip arthroplasty projections are brace.  He is doing well overall.  He did get a refill on his pain medicines yesterday.  He is on aspirin twice a day.  He examined with a rolling walker.  Notes from physical therapy will not extend his therapy for 2-3 more weeks and I agree with this.  On examination his ligaments are equal.  His incision looks good and staples were removed and Steris strips was applied.  He does have a moderate seroma and I drained about 90 cc of fluid from the soft tissues.  There is no evidence of infection.  He feels good overall.  At this point we will continue to increase his activities.  I will see him back in a month to see how he is doing overall.  No x-rays will be needed.

## 2017-03-03 ENCOUNTER — Telehealth (INDEPENDENT_AMBULATORY_CARE_PROVIDER_SITE_OTHER): Payer: Self-pay | Admitting: Orthopaedic Surgery

## 2017-03-03 ENCOUNTER — Other Ambulatory Visit (INDEPENDENT_AMBULATORY_CARE_PROVIDER_SITE_OTHER): Payer: Self-pay

## 2017-03-03 MED ORDER — DOXYCYCLINE HYCLATE 50 MG PO CAPS: 100.0000 mg | ORAL_CAPSULE | Freq: Two times a day (BID) | ORAL | 0 refills | Status: DC

## 2017-03-03 NOTE — Telephone Encounter (Signed)
See below, need to see him?

## 2017-03-03 NOTE — Telephone Encounter (Signed)
Lets see him tomorrow.  But also, call in doxycycline 100 mg to take twice daily, #30.  Have him put neosporin over the blister daily

## 2017-03-03 NOTE — Telephone Encounter (Signed)
Patient aware of the below message and will come in tomorrow morning

## 2017-03-03 NOTE — Telephone Encounter (Signed)
Patient called stating that he has a blister near the surgery cite.  Patient would like a return call please.  CB#[phone].  Thank you.

## 2017-03-04 ENCOUNTER — Encounter (INDEPENDENT_AMBULATORY_CARE_PROVIDER_SITE_OTHER): Payer: Self-pay | Admitting: Orthopaedic Surgery

## 2017-03-04 ENCOUNTER — Ambulatory Visit (INDEPENDENT_AMBULATORY_CARE_PROVIDER_SITE_OTHER): Payer: BLUE CROSS/BLUE SHIELD | Admitting: Physician Assistant

## 2017-03-04 DIAGNOSIS — Z96642 Presence of left artificial hip joint: Secondary | ICD-10-CM

## 2017-03-04 MED ORDER — OXYCODONE-ACETAMINOPHEN 5-325 MG PO TABS: 1.0000 | ORAL_TABLET | Freq: Four times a day (QID) | ORAL | 0 refills | Status: DC | PRN

## 2017-03-04 MED ORDER — METHOCARBAMOL 500 MG PO TABS: 500.0000 mg | ORAL_TABLET | Freq: Four times a day (QID) | ORAL | 0 refills | Status: DC | PRN

## 2017-03-04 NOTE — Progress Notes (Signed)
[name] comes in today for follow-up of his left total hip incision.  He is now 19 days postop.  The hip overall is doing well.  Seen today due to a blister on his incision area.  Some doxycycline was sent in for him yesterday he has not picked this up as of yet.  Has had no fevers chills shortness of breath or chest pain.  Review of systems:See HPI otherwise negative  Physical exam left hip he is able to ambulate about the room and get on and off the exam table with minimal assistance.  Left hip incision he has a positive seroma.  Surgical incisions healing well no signs of gross infection.  Does have a blister just anterior to the incision.  Blister measures approximately 3 cm in length by a centimeter in width.  Impression: 19 days status post left total hip arthroplasty  Plan: Seroma is aspirated after prep with ethyl chloride and Betadine 150 cc of serosanguineous fluid obtained.  Patient tolerated well.  In the blister is unroofed after prepping with Betadine.  Then a small piece of Xeroform is applied to the blister area and Tegaderm is applied over this.  He will keep the Tegaderm in place for the next 2 days and then removed to wash the wounds with antibacterial soap.  After bathing he is to dry the area thoroughly and find a new area of Xeroform over the blister site and a new Tegaderm leaving in place for another 2 days.  He may be a twin Tegaderm changes against the area when integrity is compromised a we will just replace sooner.  He is to start taking the doxycycline as prescribed.  Refill on his oxycodone and Robaxin given today.  He will follow-up with us in a week sooner if there is any questions or concerns

## 2017-03-11 ENCOUNTER — Ambulatory Visit (INDEPENDENT_AMBULATORY_CARE_PROVIDER_SITE_OTHER): Payer: BLUE CROSS/BLUE SHIELD | Admitting: Orthopaedic Surgery

## 2017-03-11 ENCOUNTER — Encounter (INDEPENDENT_AMBULATORY_CARE_PROVIDER_SITE_OTHER): Payer: Self-pay | Admitting: Orthopaedic Surgery

## 2017-03-11 DIAGNOSIS — Z96642 Presence of left artificial hip joint: Secondary | ICD-10-CM

## 2017-03-11 NOTE — Progress Notes (Signed)
The patient is now 26 days status post a left total hip arthroplasty.  We will see him early today because we need to drain the seroma from his left hip area.  He said he is doing well overall.  On exam he does have a seroma but no evidence of infection.  His wound is healing nicely at this point.  I was able to aspirate 70 cc of fluid off of the soft tissue area and this gave him good relief.  He will keep his regular appointment in 2 weeks to see how he is doing overall but no x-rays are needed.

## 2017-03-14 ENCOUNTER — Other Ambulatory Visit (INDEPENDENT_AMBULATORY_CARE_PROVIDER_SITE_OTHER): Payer: Self-pay | Admitting: Orthopaedic Surgery

## 2017-03-16 NOTE — Telephone Encounter (Signed)
He does not need to be on the twice a day aspirin anymore at this standpoint.

## 2017-03-16 NOTE — Telephone Encounter (Signed)
Please advise 

## 2017-03-26 ENCOUNTER — Ambulatory Visit (INDEPENDENT_AMBULATORY_CARE_PROVIDER_SITE_OTHER): Payer: BLUE CROSS/BLUE SHIELD | Admitting: Orthopaedic Surgery

## 2017-03-26 ENCOUNTER — Encounter (INDEPENDENT_AMBULATORY_CARE_PROVIDER_SITE_OTHER): Payer: Self-pay | Admitting: Orthopaedic Surgery

## 2017-03-26 DIAGNOSIS — Z96642 Presence of left artificial hip joint: Secondary | ICD-10-CM

## 2017-03-26 NOTE — Progress Notes (Signed)
The patient is now 6 weeks status post left total hip arthroscopy direct anterior approach.  He says he is doing well overall.  He has had some leg swelling but overall the pain just 1 out of 10 and is just taken Tylenol for pain.  On exam his incision looks good.  He does have some firmness around the incision but no redness at all his leg lengths are equal.  He had a history of a right total hip replacement as well.  He still ambulating with a cane but easily lets me put his operative hip through range of motion.  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  I do not need to see him back for 3 months unless he is having any issues.  At that visit I would like a low AP pelvis

## 2017-06-24 ENCOUNTER — Ambulatory Visit (INDEPENDENT_AMBULATORY_CARE_PROVIDER_SITE_OTHER): Payer: BLUE CROSS/BLUE SHIELD

## 2017-06-24 ENCOUNTER — Ambulatory Visit (INDEPENDENT_AMBULATORY_CARE_PROVIDER_SITE_OTHER): Payer: BLUE CROSS/BLUE SHIELD | Admitting: Orthopaedic Surgery

## 2017-06-24 ENCOUNTER — Encounter (INDEPENDENT_AMBULATORY_CARE_PROVIDER_SITE_OTHER): Payer: Self-pay | Admitting: Orthopaedic Surgery

## 2017-06-24 DIAGNOSIS — Z96642 Presence of left artificial hip joint: Secondary | ICD-10-CM | POA: Diagnosis not present

## 2017-06-24 NOTE — Progress Notes (Signed)
                                                                                                                                                                                                                                                                                                                                  Patient is now 4 months status post a left total hip arthroplasty through direct anterior approach.  He is 3 years status post a right total hip arthroplasty.  He says he is doing well overall and occasionally has some popping in the left hip that he did have on the right hip but he says he has good range of motion and strength and no real issues.  On examination is walking without assistive device and no limp.  He tolerates me easily putting hips the range of motion on both sides with no clicking that I can elicit.  An AP pelvis shows well-seated implant bilaterally with no comp gating features.  This point continue to increase his activities as comfort allows.  I will see him back for final visit in 8 months from now with a final AP pelvis and lateral of both hips.

## 2017-07-11 ENCOUNTER — Other Ambulatory Visit: Payer: Self-pay

## 2017-07-11 ENCOUNTER — Encounter (HOSPITAL_BASED_OUTPATIENT_CLINIC_OR_DEPARTMENT_OTHER): Payer: Self-pay

## 2017-07-11 ENCOUNTER — Observation Stay (HOSPITAL_BASED_OUTPATIENT_CLINIC_OR_DEPARTMENT_OTHER)
Admission: EM | Admit: 2017-07-11 | Discharge: 2017-07-13 | Disposition: A | Discharge status: Home / Self Care | Payer: BLUE CROSS/BLUE SHIELD | Attending: Family Medicine | Admitting: Family Medicine

## 2017-07-11 DIAGNOSIS — Z87891 Personal history of nicotine dependence: Secondary | ICD-10-CM | POA: Diagnosis not present

## 2017-07-11 DIAGNOSIS — Z96643 Presence of artificial hip joint, bilateral: Secondary | ICD-10-CM | POA: Diagnosis not present

## 2017-07-11 DIAGNOSIS — J45909 Unspecified asthma, uncomplicated: Secondary | ICD-10-CM | POA: Insufficient documentation

## 2017-07-11 DIAGNOSIS — Z79899 Other long term (current) drug therapy: Secondary | ICD-10-CM | POA: Diagnosis not present

## 2017-07-11 DIAGNOSIS — T783XXA Angioneurotic edema, initial encounter: Secondary | ICD-10-CM | POA: Diagnosis present

## 2017-07-11 DIAGNOSIS — E871 Hypo-osmolality and hyponatremia: Secondary | ICD-10-CM | POA: Diagnosis present

## 2017-07-11 DIAGNOSIS — I10 Essential (primary) hypertension: Secondary | ICD-10-CM | POA: Diagnosis not present

## 2017-07-11 DIAGNOSIS — Z7982 Long term (current) use of aspirin: Secondary | ICD-10-CM | POA: Diagnosis not present

## 2017-07-11 MED ORDER — DIPHENHYDRAMINE HCL 50 MG/ML IJ SOLN
50.0000 mg | Freq: Once | INTRAMUSCULAR | Status: AC
  Administered 2017-07-11: 50 mg via INTRAVENOUS
  Filled 2017-07-11: qty 1

## 2017-07-11 MED ORDER — FAMOTIDINE IN NACL 20-0.9 MG/50ML-% IV SOLN
20.0000 mg | Freq: Once | INTRAVENOUS | Status: AC
  Administered 2017-07-11: 20 mg via INTRAVENOUS
  Filled 2017-07-11: qty 50

## 2017-07-11 NOTE — ED Notes (Signed)
ED Provider at bedside. 

## 2017-07-11 NOTE — ED Notes (Signed)
Pt reports taking 2 doses of children's benadryl PTA.

## 2017-07-11 NOTE — ED Triage Notes (Signed)
Pt presents with swollen bottom lip. Pt taking lisinopril. Pt able to speak in clear sentences.

## 2017-07-12 ENCOUNTER — Encounter (HOSPITAL_COMMUNITY): Payer: Self-pay | Admitting: Family Medicine

## 2017-07-12 ENCOUNTER — Other Ambulatory Visit: Payer: Self-pay

## 2017-07-12 DIAGNOSIS — E871 Hypo-osmolality and hyponatremia: Secondary | ICD-10-CM | POA: Diagnosis not present

## 2017-07-12 DIAGNOSIS — J45909 Unspecified asthma, uncomplicated: Secondary | ICD-10-CM | POA: Diagnosis not present

## 2017-07-12 DIAGNOSIS — Z7982 Long term (current) use of aspirin: Secondary | ICD-10-CM | POA: Diagnosis not present

## 2017-07-12 DIAGNOSIS — Z96643 Presence of artificial hip joint, bilateral: Secondary | ICD-10-CM | POA: Diagnosis not present

## 2017-07-12 DIAGNOSIS — Z79899 Other long term (current) drug therapy: Secondary | ICD-10-CM | POA: Diagnosis not present

## 2017-07-12 DIAGNOSIS — L409 Psoriasis, unspecified: Secondary | ICD-10-CM

## 2017-07-12 DIAGNOSIS — Z87891 Personal history of nicotine dependence: Secondary | ICD-10-CM | POA: Diagnosis not present

## 2017-07-12 DIAGNOSIS — T783XXA Angioneurotic edema, initial encounter: Principal | ICD-10-CM

## 2017-07-12 DIAGNOSIS — I10 Essential (primary) hypertension: Secondary | ICD-10-CM

## 2017-07-12 LAB — CBC WITH DIFFERENTIAL/PLATELET
Basophils Absolute: 0 10*3/uL (ref 0.0–0.1)
Basophils Relative: 0 %
EOS ABS: 0.6 10*3/uL (ref 0.0–0.7)
EOS PCT: 10 %
HCT: 44.5 % (ref 39.0–52.0)
Hemoglobin: 15.2 g/dL (ref 13.0–17.0)
LYMPHS PCT: 16 %
Lymphs Abs: 1.1 10*3/uL (ref 0.7–4.0)
MCH: 32.5 pg (ref 26.0–34.0)
MCHC: 34.2 g/dL (ref 30.0–36.0)
MCV: 95.1 fL (ref 78.0–100.0)
MONOS PCT: 11 %
Monocytes Absolute: 0.7 10*3/uL (ref 0.1–1.0)
Neutro Abs: 4.2 10*3/uL (ref 1.7–7.7)
Neutrophils Relative %: 63 %
PLATELETS: 179 10*3/uL (ref 150–400)
RBC: 4.68 MIL/uL (ref 4.22–5.81)
RDW: 12.8 % (ref 11.5–15.5)
WBC: 6.7 10*3/uL (ref 4.0–10.5)

## 2017-07-12 LAB — BASIC METABOLIC PANEL
Anion gap: 10 (ref 5–15)
Anion gap: 9 (ref 5–15)
BUN: 5 mg/dL — ABNORMAL LOW (ref 6–20)
BUN: 7 mg/dL (ref 6–20)
CHLORIDE: 103 mmol/L (ref 101–111)
CHLORIDE: 99 mmol/L — AB (ref 101–111)
CO2: 21 mmol/L — AB (ref 22–32)
CO2: 24 mmol/L (ref 22–32)
CREATININE: 0.66 mg/dL (ref 0.61–1.24)
Calcium: 9.7 mg/dL (ref 8.9–10.3)
Calcium: 9.8 mg/dL (ref 8.9–10.3)
Creatinine, Ser: 0.58 mg/dL — ABNORMAL LOW (ref 0.61–1.24)
GFR calc Af Amer: >60 mL/min (ref >60)
GFR calc Af Amer: >60 mL/min (ref >60)
GFR calc non Af Amer: >60 mL/min (ref >60)
GFR calc non Af Amer: >60 mL/min (ref >60)
GLUCOSE: 95 mg/dL (ref 65–99)
GLUCOSE: 99 mg/dL (ref 65–99)
POTASSIUM: 3.6 mmol/L (ref 3.5–5.1)
Potassium: 3.5 mmol/L (ref 3.5–5.1)
Sodium: 132 mmol/L — ABNORMAL LOW (ref 135–145)
Sodium: 134 mmol/L — ABNORMAL LOW (ref 135–145)

## 2017-07-12 LAB — HIV ANTIBODY (ROUTINE TESTING W REFLEX): HIV Screen 4th Generation wRfx: NONREACTIVE

## 2017-07-12 MED ORDER — DIPHENHYDRAMINE HCL 50 MG/ML IJ SOLN
25.0000 mg | Freq: Once | INTRAMUSCULAR | Status: AC
  Administered 2017-07-12: 25 mg via INTRAVENOUS
  Filled 2017-07-12: qty 1

## 2017-07-12 MED ORDER — ASPIRIN 81 MG PO CHEW: 81.0000 mg | CHEWABLE_TABLET | Freq: Two times a day (BID) | ORAL | Status: DC

## 2017-07-12 MED ORDER — AMLODIPINE BESYLATE 10 MG PO TABS
10.0000 mg | ORAL_TABLET | Freq: Every morning | ORAL | Status: DC
  Administered 2017-07-12 – 2017-07-13 (×2): 10 mg via ORAL
  Filled 2017-07-12: qty 2
  Filled 2017-07-12: qty 1

## 2017-07-12 MED ORDER — CALCITRIOL 3 MCG/GM EX OINT: 1.0000 "application " | TOPICAL_OINTMENT | Freq: Every day | CUTANEOUS | Status: DC | PRN

## 2017-07-12 MED ORDER — ALBUTEROL SULFATE (2.5 MG/3ML) 0.083% IN NEBU: 2.5000 mg | INHALATION_SOLUTION | RESPIRATORY_TRACT | Status: DC | PRN

## 2017-07-12 MED ORDER — ONDANSETRON HCL 4 MG PO TABS: 4.0000 mg | ORAL_TABLET | Freq: Four times a day (QID) | ORAL | Status: DC | PRN

## 2017-07-12 MED ORDER — ENOXAPARIN SODIUM 40 MG/0.4ML ~~LOC~~ SOLN
40.0000 mg | Freq: Every day | SUBCUTANEOUS | Status: DC
  Administered 2017-07-12 – 2017-07-13 (×2): 40 mg via SUBCUTANEOUS
  Filled 2017-07-12 (×3): qty 0.4

## 2017-07-12 MED ORDER — FAMOTIDINE IN NACL 20-0.9 MG/50ML-% IV SOLN
20.0000 mg | Freq: Two times a day (BID) | INTRAVENOUS | Status: DC
  Administered 2017-07-12 – 2017-07-13 (×3): 20 mg via INTRAVENOUS
  Filled 2017-07-12 (×3): qty 50

## 2017-07-12 MED ORDER — SALINE SPRAY 0.65 % NA SOLN: 2.0000 | Freq: Every day | NASAL | Status: DC | PRN

## 2017-07-12 MED ORDER — ACETAMINOPHEN 325 MG PO TABS: 650.0000 mg | ORAL_TABLET | Freq: Four times a day (QID) | ORAL | Status: DC | PRN

## 2017-07-12 MED ORDER — EPINEPHRINE 0.3 MG/0.3ML IJ SOAJ
0.3000 mg | Freq: Once | INTRAMUSCULAR | Status: AC
  Administered 2017-07-12: 0.3 mg via INTRAMUSCULAR
  Filled 2017-07-12: qty 0.3

## 2017-07-12 MED ORDER — ACETAMINOPHEN 650 MG RE SUPP: 650.0000 mg | Freq: Four times a day (QID) | RECTAL | Status: DC | PRN

## 2017-07-12 MED ORDER — HYDROCODONE-ACETAMINOPHEN 5-325 MG PO TABS: 1.0000 | ORAL_TABLET | ORAL | Status: DC | PRN

## 2017-07-12 MED ORDER — EPINEPHRINE 0.3 MG/0.3ML IJ SOAJ
0.3000 mg | Freq: Once | INTRAMUSCULAR | Status: DC | PRN
  Filled 2017-07-12: qty 0.3

## 2017-07-12 MED ORDER — SODIUM CHLORIDE 0.9% FLUSH: 3.0000 mL | INTRAVENOUS | Status: DC | PRN

## 2017-07-12 MED ORDER — DIPHENHYDRAMINE HCL 50 MG/ML IJ SOLN
25.0000 mg | Freq: Four times a day (QID) | INTRAMUSCULAR | Status: DC | PRN
  Administered 2017-07-12 – 2017-07-13 (×2): 25 mg via INTRAVENOUS
  Filled 2017-07-12 (×2): qty 1

## 2017-07-12 MED ORDER — EPINEPHRINE (ANAPHYLAXIS) 1 MG/ML IJ SOLN
0.3000 mg | Freq: Once | INTRAMUSCULAR | Status: DC | PRN
  Filled 2017-07-12: qty 0.3

## 2017-07-12 MED ORDER — SODIUM CHLORIDE 0.9% FLUSH
3.0000 mL | Freq: Two times a day (BID) | INTRAVENOUS | Status: DC
  Administered 2017-07-12: 3 mL via INTRAVENOUS

## 2017-07-12 MED ORDER — CLOBETASOL PROPIONATE 0.05 % EX CREA: 1.0000 "application " | TOPICAL_CREAM | Freq: Every day | CUTANEOUS | Status: DC

## 2017-07-12 MED ORDER — SODIUM CHLORIDE 0.9% FLUSH: 3.0000 mL | Freq: Two times a day (BID) | INTRAVENOUS | Status: DC

## 2017-07-12 MED ORDER — ONDANSETRON HCL 4 MG/2ML IJ SOLN: 4.0000 mg | Freq: Four times a day (QID) | INTRAMUSCULAR | Status: DC | PRN

## 2017-07-12 MED ORDER — SODIUM CHLORIDE 0.9 % IV SOLN: 250.0000 mL | INTRAVENOUS | Status: DC | PRN

## 2017-07-12 NOTE — ED Provider Notes (Signed)
Patient transferred from outside facility with angioedema.  He does take lisinopril.  He received epinephrine, Benadryl and Pepcid.  He did not receive steroids as prednisone has reportedly caused "swelling" in the past.  Patient reports that his lower lip has gotten progressively worse and are starting to be some involvement of his upper lip.  No tongue swelling.  He is speaking in full sentences.  There is no uvula or posterior pharynx swelling.  Lungs are clear without wheezing.  Patient will be observed in the ED as there are no inpatient beds available.  Discussed with Dr. Antionette Charpyd of hospitalist service who states patient will need admission request if admission is desired.  Patient with progressively worsening lower lip and upper lip edema.  He has no tongue or posterior pharynx swelling.  Minimal uvular swelling.  Will require ongoing observation admission. Discussed with Dr. Antionette Charpyd.  CRITICAL CARE Performed by: Glynn OctaveANCOUR, Mickell Birdwell Total critical care time: 35 minutes Critical care time was exclusive of separately billable procedures and treating other patients. Critical care was necessary to treat or prevent imminent or life-threatening deterioration. Critical care was time spent personally by me on the following activities: development of treatment plan with patient and/or surrogate as well as nursing, discussions with consultants, evaluation of patient's response to treatment, examination of patient, obtaining history from patient or surrogate, ordering and performing treatments and interventions, ordering and review of laboratory studies, ordering and review of radiographic studies, pulse oximetry and re-evaluation of patient's condition.    Glynn Octaveancour, Doreena Maulden, MD 07/12/17 [phone]

## 2017-07-12 NOTE — ED Notes (Signed)
gingerale given-- no difficulty swallowing

## 2017-07-12 NOTE — ED Provider Notes (Signed)
[facility] EMERGENCY DEPARTMENT Provider Note   CSN: [id number] Arrival date & time: 07/11/17  2235     History   Chief Complaint Chief Complaint  Patient presents with  . Angioedema    HPI Donald [name] is a 62 y.o. male.  The history is provided by the patient. No language interpreter was used.    Donald [name] is a 62 y.o. male who presents to the Emergency Department complaining of lip swelling. He began feeling that swelling and burning on his lower lip earlier today. He thought this was related to eating some pineapple. Around 7 PM he noticed that there were bumps on his lower lip and he took about 50 mg of children's Benadryl. The swelling persisted and worsened and spread to the left side of his cheek and he decided to come in for evaluation. Over the last several hours his swelling had significantly worsened. He denies any chest pain, shortness of breath, throat swelling, tongue swelling. He does have a history of hypertension and takes lisinopril as well as two other blood pressure medications. Symptoms are severe, constant, worsening.  Past Medical History:  Diagnosis Date  . Arthritis   . Asthma   . Hyperlipidemia   . Hypertension   . Sleep apnea    cpap settings ? 10     Patient Active Problem List   Diagnosis Date Noted  . Osteoarthritis of left hip 02/13/2017  . 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 Primary osteoarthritis of right hip 03/10/2014  . Status post total replacement of right hip 03/10/2014  . Diastasis recti 02/21/2013    Past Surgical History:  Procedure Laterality Date  . HERNIA REPAIR     x 2   . SINUS EXPLORATION    . TOTAL HIP ARTHROPLASTY Right 03/10/2014   Procedure: RIGHT TOTAL HIP ARTHROPLASTY ANTERIOR APPROACH;  Surgeon: Kathryne Hitchhristopher Y Blackman, MD;  Location: [facility];  Service: Orthopedics;  Laterality: Right;  . TOTAL HIP ARTHROPLASTY Left 02/13/2017   Procedure: LEFT TOTAL HIP ARTHROPLASTY  ANTERIOR APPROACH;  Surgeon: Kathryne HitchBlackman, Christopher Y, MD;  Location: [facility];  Service: Orthopedics;  Laterality: Left;  . URETEROSCOPY     multiple times        Home Medications    Prior to Admission medications   Medication Sig Start Date End Date Taking? Authorizing Provider  acetaminophen (TYLENOL) 500 MG tablet Take 500-1,000 mg by mouth every 6 (six) hours as needed for moderate pain or headache.    [provider]  albuterol (PROAIR HFA) 108 (90 BASE) MCG/ACT inhaler Inhale 1-2 puffs into the lungs every 4 (four) hours as needed for wheezing or shortness of breath.    [provider]  amLODipine (NORVASC) 10 MG tablet Take 10 mg by mouth every morning.  01/04/13   [provider]  aspirin 81 MG chewable tablet Chew 1 tablet (81 mg total) by mouth 2 (two) times daily. 02/14/17   Kathryne HitchBlackman, Christopher Y, MD  clobetasol cream (TEMOVATE) 0.05 % Apply 1 application topically daily. 01/12/17   [provider]  doxycycline (VIBRAMYCIN) 50 MG capsule Take 2 capsules (100 mg total) 2 (two) times daily by mouth. Patient not taking: Reported on 03/26/2017 03/03/17   Kathryne HitchBlackman, Christopher Y, MD  hydrochlorothiazide (HYDRODIURIL) 25 MG tablet Take 25 mg by mouth every morning.  01/31/13   [provider]  ibuprofen (ADVIL,MOTRIN) 200 MG tablet Take 200-400 mg by mouth every 6 (six) hours as needed for  headache or moderate pain.     [provider]  lisinopril (PRINIVIL,ZESTRIL) 40 MG tablet Take 40 mg by mouth every morning.  01/04/13   [provider]  methocarbamol (ROBAXIN) 500 MG tablet Take 1 tablet (500 mg total) every 6 (six) hours as needed by mouth for muscle spasms. Patient not taking: Reported on 03/26/2017 03/04/17   Kirtland Bouchard, PA-C  Multiple Vitamins-Minerals (OCUVITE PO) Take 1 capsule by mouth 4 (four) times a week.    [provider]  oxyCODONE-acetaminophen (ROXICET) 5-325 MG tablet Take 1-2 tablets every 6  (six) hours as needed by mouth. Patient not taking: Reported on 03/26/2017 03/04/17   Kirtland Bouchard, PA-C  POTASSIUM PO Take 2 tablets by mouth daily as needed (for heart racing/cramping).     [provider]  sodium chloride (OCEAN) 0.65 % SOLN nasal spray Place 2-3 sprays into both nostrils daily as needed for congestion.     [provider]  TURMERIC PO Take 1 capsule by mouth 2 (two) times a week.    [provider]  VECTICAL 3 MCG/GM cream Apply 1 application topically daily as needed (for psoriasis).  12/28/12   [provider]    Family History No family history on file.  Social History Social History   Tobacco Use  . Smoking status: Former Smoker    Packs/day: 3.00    Types: Cigarettes    Last attempt to quit: 1981    Years since quitting: 38.2  . Smokeless tobacco: Never Used  . Tobacco comment: patient quit smoking around 25  Substance Use Topics  . Alcohol use: Yes    Alcohol/week: 21.0 oz    Types: 21 Cans of beer, 14 Shots of liquor per week  . Drug use: No     Allergies   Lisinopril; Prednisone; and Tetracyclines & related   Review of Systems Review of Systems  All other systems reviewed and are negative.    Physical Exam Updated Vital Signs BP 126/68   Pulse 71   Temp 98.2 F (36.8 C) (Oral)   Resp 17   Ht 5\' 9"  (1.753 m)   Wt 130.6 kg (288 lb)   SpO2 96%   BMI 42.53 kg/m   Physical Exam  Constitutional: He is oriented to person, place, and time. He appears well-developed and well-nourished.  HENT:  Head: Normocephalic and atraumatic.  Minimal swelling of the uvula. Severe swelling of the lower lip extending across the left lower jaw extending to the maxilla. No significant erythema or tenderness. Soft submental space. No tongue swelling.  Cardiovascular: Normal rate and regular rhythm.  No murmur heard. Pulmonary/Chest: Effort normal and breath sounds normal. No stridor. No respiratory distress.    Abdominal: Soft. There is no tenderness. There is no rebound and no guarding.  Musculoskeletal: He exhibits no tenderness.  Pitting edema to bilateral lower extremities  Neurological: He is alert and oriented to person, place, and time.  Skin: Skin is warm and dry.  Psychiatric: He has a normal mood and affect. His behavior is normal.  Nursing note and vitals reviewed.      ED Treatments / Results  Labs (all labs ordered are listed, but only abnormal results are displayed) Labs Reviewed  BASIC METABOLIC PANEL - Abnormal; Notable for the following components:      Result Value   Sodium 132 (*)    Chloride 99 (*)    All other components within normal limits  CBC WITH DIFFERENTIAL/PLATELET  EKG  EKG Interpretation None       Radiology No results found.  Procedures Procedures (including critical care time) CRITICAL CARE Performed by: Tilden Fossa   Total critical care time: 45 minutes  Critical care time was exclusive of separately billable procedures and treating other patients.  Critical care was necessary to treat or prevent imminent or life-threatening deterioration.  Critical care was time spent personally by me on the following activities: development of treatment plan with patient and/or surrogate as well as nursing, discussions with consultants, evaluation of patient's response to treatment, examination of patient, obtaining history from patient or surrogate, ordering and performing treatments and interventions, ordering and review of laboratory studies, ordering and review of radiographic studies, pulse oximetry and re-evaluation of patient's condition.  Medications Ordered in ED Medications  famotidine (PEPCID) IVPB 20 mg premix (20 mg Intravenous New Bag/Given 07/11/17 2346)  diphenhydrAMINE (BENADRYL) injection 50 mg (50 mg Intravenous Given 07/11/17 2347)  EPINEPHrine (EPI-PEN) injection 0.3 mg (0.3 mg Intramuscular Given 07/12/17 0040)     Initial  Impression / Assessment and Plan / ED Course  I have reviewed the triage vital signs and the nursing notes.  Pertinent labs & imaging results that were available during my care of the patient were reviewed by me and considered in my medical decision making (see chart for details).     Patient here for swelling to the lower lip that began earlier tonight. He is in no respiratory distress on examination. He does have some minimal fullness to the uvula but feels like his throat feels like it's baseline. On repeat assessment about midnight he states that it feels like the swelling in his left face is gotten a little bit worse. Repeat assessment with similar exam to initial. Discussed with patient recommendation for observation given progression of symptoms. He does not wish for admission at this time. He wants to discuss with family. Following further discussions with family at about 1215 patient is in agreement with observation admission. At about 1230 patient feels like he has had increased progression to the left upper lip as well as her right face. On repeat assessment he does have mild to moderate edema of the left upper lip as well as persistent lower lip edema. Left cheek has similar edema and there is minimal edema to the right lower cheek. There is no edema in the posterior oropharynx or tongue. Will provide epinephrine IM. Discussed with Dr. Antionette Char with the hospital [facility] who accepts the patient for observation admission. Discussed with Dr. Jeraldine Loots the emergency department to accept the patient and transfer for ED to ED transfer for further observation.  Final Clinical Impressions(s) / ED Diagnoses   Final diagnoses:  Angioedema of lips, initial encounter    ED Discharge Orders    None       Tilden Fossa, MD 07/12/17 [id number]

## 2017-07-12 NOTE — ED Notes (Signed)
L side of top lip edematous. EDP aware.

## 2017-07-12 NOTE — ED Notes (Signed)
Heart healthy breakfast tray ordered 

## 2017-07-12 NOTE — H&P (Signed)
History and Physical    Donald [name] ZOX:[id number] DOB: 03-24-1956 DOA: 07/11/2017  PCP: Farris Has, MD   Patient coming from: Home   Chief Complaint: Lip swelling   HPI: Donald [name] is a 62 y.o. male with medical history significant for hypertension and psoriasis, presenting to the emergency department for evaluation of the lower lip swelling.  Patient reports that several days ago, he had some difficulty swallowing and noted in the mirror that his uvula seemed to be swollen.  These symptoms resolved spontaneously within about a day, he remained well otherwise, and then noted slight swelling of the lower lip yesterday afternoon.  Lower lip swelling continued to worsen slowly and he went into the ED for evaluation.  He denies tongue swelling, difficulty swallowing, shortness of breath, wheezing, noisy breathing, or difficulty with his speech.  There has not been any rash, itching, lightheadedness, or GI complaints.  He had never experienced these symptoms previously.  He takes lisinopril daily.  ED Course: Upon arrival to the ED, patient is found to be afebrile, saturating well on room air, and with vitals otherwise normal.  Chemistry panel is notable for mild hyponatremia and CBC is unremarkable.  Patient was treated with Benadryl IV and IV Pepcid.  He was monitored in the emergency department where the lower lip swelling persisted unchanged and he began to develop some swelling of the left upper lip.  Given the proximity to the airway, patient will be observed in the stepdown unit for ongoing evaluation and management of angioedema, suspected secondary to ACE-inhibitor.  Review of Systems:  All other systems reviewed and apart from HPI, are negative.  Past Medical History:  Diagnosis Date  . Arthritis   . Asthma   . Hyperlipidemia   . Hypertension   . Sleep apnea    cpap settings ? 10     Past Surgical History:  Procedure Laterality Date  . HERNIA REPAIR     x 2   . SINUS  EXPLORATION    . TOTAL HIP ARTHROPLASTY Right 03/10/2014   Procedure: RIGHT TOTAL HIP ARTHROPLASTY ANTERIOR APPROACH;  Surgeon: Kathryne Hitch, MD;  Location: [facility];  Service: Orthopedics;  Laterality: Right;  . TOTAL HIP ARTHROPLASTY Left 02/13/2017   Procedure: LEFT TOTAL HIP ARTHROPLASTY ANTERIOR APPROACH;  Surgeon: Kathryne Hitch, MD;  Location: [facility];  Service: Orthopedics;  Laterality: Left;  . URETEROSCOPY     multiple times      reports that he quit smoking about 38 years ago. His smoking use included cigarettes. He smoked 3.00 packs per day. he has never used smokeless tobacco. He reports that he drinks about 21.0 oz of alcohol per week. He reports that he does not use drugs.  Allergies  Allergen Reactions  . Lisinopril Other (See Comments)    Angioedema  . Prednisone Swelling  . Tetracyclines & Related Nausea Only    History reviewed. No pertinent family history.   Prior to Admission medications   Medication Sig Start Date End Date Taking? Authorizing Provider  acetaminophen (TYLENOL) 500 MG tablet Take 500-1,000 mg by mouth every 6 (six) hours as needed for moderate pain or headache.    [provider]  albuterol (PROAIR HFA) 108 (90 BASE) MCG/ACT inhaler Inhale 1-2 puffs into the lungs every 4 (four) hours as needed for wheezing or shortness of breath.    [provider]  amLODipine (NORVASC) 10 MG tablet Take 10 mg by mouth every morning.  01/04/13  [provider]  aspirin 81 MG chewable tablet Chew 1 tablet (81 mg total) by mouth 2 (two) times daily. 02/14/17   Kathryne HitchBlackman, Christopher Y, MD  clobetasol cream (TEMOVATE) 0.05 % Apply 1 application topically daily. 01/12/17   [provider]  hydrochlorothiazide (HYDRODIURIL) 25 MG tablet Take 25 mg by mouth every morning.  01/31/13   [provider]  ibuprofen (ADVIL,MOTRIN) 200 MG tablet Take 200-400 mg by mouth every 6 (six) hours as needed for headache or  moderate pain.     [provider]  Multiple Vitamins-Minerals (OCUVITE PO) Take 1 capsule by mouth 4 (four) times a week.    [provider]  POTASSIUM PO Take 2 tablets by mouth daily as needed (for heart racing/cramping).     [provider]  sodium chloride (OCEAN) 0.65 % SOLN nasal spray Place 2-3 sprays into both nostrils daily as needed for congestion.     [provider]  TURMERIC PO Take 1 capsule by mouth 2 (two) times a week.    [provider]  VECTICAL 3 MCG/GM cream Apply 1 application topically daily as needed (for psoriasis).  12/28/12   [provider]    Physical Exam: Vitals:   07/12/17 0030 07/12/17 0045 07/12/17 0058 07/12/17 0136  BP: 132/76  139/67 137/75  Pulse: 76 99 80 79  Resp:   16 18  Temp:      TempSrc:      SpO2: 97% 100% 98% 96%  Weight:      Height:          Constitutional: NAD, calm  Eyes: PERTLA, lids and conjunctivae normal ENMT: Edema involving lower lip and left third of upper lip. Posterior pharynx clear of any exudate or lesions.   Neck: normal, supple, no masses, no thyromegaly Respiratory: clear to auscultation bilaterally, no wheezing, no stridor. Normal respiratory effort.  Cardiovascular: S1 & S2 heard, regular rate and rhythm. 1+ pretibial edema bilaterally. No significant JVD. Abdomen: No distension, no tenderness, no masses palpated. Bowel sounds active.  Musculoskeletal: no clubbing / cyanosis. No joint deformity upper and lower extremities.   Skin: no significant rashes, lesions, ulcers. Warm, dry, well-perfused. Neurologic: CN 2-12 grossly intact. Sensation intact. Strength 5/5 in all 4 limbs.  Psychiatric:  Alert and oriented x 3. Calm. Cooperative.      Labs on Admission: I have personally reviewed following labs and imaging studies  CBC: Recent Labs  Lab 07/12/17 0004  WBC 6.7  NEUTROABS 4.2  HGB 15.2  HCT 44.5  MCV 95.1  PLT 179   Basic Metabolic Panel: Recent  Labs  Lab 07/12/17 0004  NA 132*  K 3.5  CL 99*  CO2 24  GLUCOSE 99  BUN 7  CREATININE 0.66  CALCIUM 9.8   GFR: Estimated Creatinine Clearance: 129.9 mL/min (by C-G formula based on SCr of 0.66 mg/dL). Liver Function Tests: No results for input(s): AST, ALT, ALKPHOS, BILITOT, PROT, ALBUMIN in the last 168 hours. No results for input(s): LIPASE, AMYLASE in the last 168 hours. No results for input(s): AMMONIA in the last 168 hours. Coagulation Profile: No results for input(s): INR, PROTIME in the last 168 hours. Cardiac Enzymes: No results for input(s): CKTOTAL, CKMB, CKMBINDEX, TROPONINI in the last 168 hours. BNP (last 3 results) No results for input(s): PROBNP in the last 8760 hours. HbA1C: No results for input(s): HGBA1C in the last 72 hours. CBG: No results for input(s): GLUCAP in the last 168 hours. Lipid Profile: No  results for input(s): CHOL, HDL, LDLCALC, TRIG, CHOLHDL, LDLDIRECT in the last 72 hours. Thyroid Function Tests: No results for input(s): TSH, T4TOTAL, FREET4, T3FREE, THYROIDAB in the last 72 hours. Anemia Panel: No results for input(s): VITAMINB12, FOLATE, FERRITIN, TIBC, IRON, RETICCTPCT in the last 72 hours. Urine analysis:    Component Value Date/Time   COLORURINE YELLOW 03/02/2014 1455   APPEARANCEUR CLOUDY (A) 03/02/2014 1455   LABSPEC 1.013 03/02/2014 1455   PHURINE 7.0 03/02/2014 1455   GLUCOSEU NEGATIVE 03/02/2014 1455   HGBUR NEGATIVE 03/02/2014 1455   BILIRUBINUR NEGATIVE 03/02/2014 1455   KETONESUR NEGATIVE 03/02/2014 1455   PROTEINUR NEGATIVE 03/02/2014 1455   UROBILINOGEN 0.2 03/02/2014 1455   NITRITE NEGATIVE 03/02/2014 1455   LEUKOCYTESUR TRACE (A) 03/02/2014 1455   Sepsis Labs: @LABRCNTIP (procalcitonin:4,lacticidven:4) )No results found for this or any previous visit (from the past 240 hour(s)).   Radiological Exams on Admission: No results found.  EKG: Not performed.   Assessment/Plan   1. Angioedema  - Presents with  progressive swelling of lower lip  - There has not been any respiratory signs/symptoms, hypotension, rash, or GI-distress to suggest anaphylaxis  - Most likely secondary to lisinopril  - Treated in ED with Pepcid, Benadryl x2, and IM epi, but had spread to left corner of upper lip, though still no respiratory s/s  - He reports allergy to prednisone and did not receive steroids  - Plan to observe in SDU, discontinue lisinopril and add to allergy list, check C4, continue antihistamines, use epi IM prn anaphylaxis   2. Hypertension  - BP at goal  - Lisinopril discontinued as above, continue Norvasc    3. Hyponatremia  - Serum sodium is 133 on admission  - Appears euvolemic, likely secondary to HCTZ  - Hold HCTZ, repeat chem panel   4. Psoriasis  - Stable, continue topicals     DVT prophylaxis: Lovenox Code Status: Full  Family Communication: Discussed with patient Consults called: None Admission status: Observation    Briscoe Deutscher, MD [facility] Pager [phone]  If 7PM-7AM, please contact night-coverage [email or web address] Password [facility]  07/12/2017, 3:56 AM

## 2017-07-12 NOTE — ED Notes (Signed)
Report called to [facility] on [facility]

## 2017-07-12 NOTE — Progress Notes (Signed)
Patient seen and evaluated earlier this am. Will continue to monitor. He reports that his swelling is going down.  Gen: pt in nad, alert and awake CV: No cyanosis Pulm: no increased wob, no wheezes  Penny Piarlando Zackery Brine MD

## 2017-07-12 NOTE — ED Notes (Signed)
Pt states edema to L face has worsened. EDP notified. Orders to follow.

## 2017-07-12 NOTE — ED Notes (Signed)
Pt reports R side of face beginning to swell. Still speaking in full sentences, denies respiratory distress or throat tightening. EDP notified. Orders to follow.

## 2017-07-12 NOTE — ED Notes (Signed)
[facility]. Initially had swelling to lower lip that progressed to swelling to top lip after receiving medications. Airway intact, speaking in full sentences, denies pain

## 2017-07-13 DIAGNOSIS — T783XXA Angioneurotic edema, initial encounter: Secondary | ICD-10-CM | POA: Diagnosis not present

## 2017-07-13 LAB — C4 COMPLEMENT: COMPLEMENT C4, BODY FLUID: 29 mg/dL (ref 14–44)

## 2017-07-13 NOTE — Plan of Care (Signed)
  Progressing Clinical Measurements: Will remain free from infection 07/13/2017 0409 - Progressing by Horris Latinouvall, Nevaan Bunton G, RN Note No s/s of infection noted. Respiratory complications will improve 07/13/2017 0409 - Progressing by Horris Latinouvall, Nuh Lipton G, RN Note No s/s of respiratory complications. Cardiovascular complication will be avoided 07/13/2017 0409 - Progressing by Horris Latinouvall, Erby Sanderson G, RN Note No s/s of cardiovascular complications.

## 2017-07-13 NOTE — Progress Notes (Signed)
New order for CPAP, however patient refused as he wants nasal pillows only.

## 2017-07-13 NOTE — Discharge Summary (Signed)
Physician Discharge Summary  Donald [name] ZOX:[id number] DOB: 02-07-1956 DOA: 07/11/2017  PCP: Farris Has, MD  Admit date: 07/11/2017 Discharge date: 07/13/2017  Time spent: > 35 minutes  Recommendations for Outpatient Follow-up:  1. Avoid lisinopril given recent diagnosis of angioedema   Discharge Diagnoses:  Principal Problem:   Angioedema of lips Active Problems:   Hypertension   Hyponatremia   Discharge Condition: stable  Diet recommendation: heart healthy  Filed Weights   07/11/17 2240 07/12/17 1357 07/13/17 0434  Weight: 130.6 kg (288 lb) 132.3 kg (291 lb 10.7 oz) 128.2 kg (282 lb 9.6 oz)    History of present illness:   62 y.o. male with medical history significant for hypertension and psoriasis, presenting to the emergency department for evaluation of the lower lip swelling  Dx with angioedema after taking lisinopril.  Hospital Course:  Angioedema - Provoked by lisinopril use - completely resolved after epi, famotidine, and benadryl - Pt back to baseline on day of discharge.  - he is to avoid using lisinopril in the future  Otherwise patient is to continue prior to admission medication regimen  Procedures:  none  Consultations:  none  Discharge Exam: Vitals:   07/13/17 0434 07/13/17 0803  BP: 139/71 (!) 145/75  Pulse: 65 68  Resp: 20   Temp: 98.2 F (36.8 C) 97.6 F (36.4 C)  SpO2: 97% 95%    General: Pt in nad, alert and awake Cardiovascular: rrr, no rubs Respiratory: no increased wob, no wheezes  Discharge Instructions   Discharge Instructions    Call MD for:  severe uncontrolled pain   Complete by:  As directed    Call MD for:  temperature >100.4   Complete by:  As directed    Diet - low sodium heart healthy   Complete by:  As directed    Discharge instructions   Complete by:  As directed    Please f/u with your pcp and avoid any ACE inhibitors   Increase activity slowly   Complete by:  As directed      Allergies as of  07/13/2017      Reactions   Lisinopril Other (See Comments)   Angioedema   Prednisone Swelling   Tetracyclines & Related Nausea Only      Medication List    TAKE these medications   acetaminophen 500 MG tablet Commonly known as:  TYLENOL Take 500-1,000 mg by mouth every 6 (six) hours as needed for moderate pain or headache.   amLODipine 10 MG tablet Commonly known as:  NORVASC Take 10 mg by mouth every morning.   hydrochlorothiazide 25 MG tablet Commonly known as:  HYDRODIURIL Take 25 mg by mouth every morning.   hydroxypropyl methylcellulose / hypromellose 2.5 % ophthalmic solution Commonly known as:  ISOPTO TEARS / GONIOVISC Place 1 drop into both eyes 3 (three) times daily as needed for dry eyes.   ibuprofen 200 MG tablet Commonly known as:  ADVIL,MOTRIN Take 200-400 mg by mouth every 6 (six) hours as needed for headache or moderate pain.   POTASSIUM PO Take 2 tablets by mouth daily as needed (for heart racing/cramping).   PROAIR HFA 108 (90 Base) MCG/ACT inhaler Generic drug:  albuterol Inhale 1-2 puffs into the lungs every 4 (four) hours as needed for wheezing or shortness of breath.   sodium chloride 0.65 % Soln nasal spray Commonly known as:  OCEAN Place 2-3 sprays into both nostrils daily as needed for congestion.   VECTICAL 3 MCG/GM cream Generic drug:  Calcitriol Apply 1 application topically daily as needed (for psoriasis).      Allergies  Allergen Reactions  . Lisinopril Other (See Comments)    Angioedema  . Prednisone Swelling  . Tetracyclines & Related Nausea Only      The results of significant diagnostics from this hospitalization (including imaging, microbiology, ancillary and laboratory) are listed below for reference.    Significant Diagnostic Studies: Xr Pelvis 1-2 Views  Result Date: 06/24/2017 An AP pelvis shows bilateral hip replacements with no comp gating features.   Microbiology: No results found for this or any previous  visit (from the past 240 hour(s)).   Labs: Basic Metabolic Panel: Recent Labs  Lab 07/12/17 0004 07/12/17 0404  NA 132* 134*  K 3.5 3.6  CL 99* 103  CO2 24 21*  GLUCOSE 99 95  BUN 7 5*  CREATININE 0.66 0.58*  CALCIUM 9.8 9.7   Liver Function Tests: No results for input(s): AST, ALT, ALKPHOS, BILITOT, PROT, ALBUMIN in the last 168 hours. No results for input(s): LIPASE, AMYLASE in the last 168 hours. No results for input(s): AMMONIA in the last 168 hours. CBC: Recent Labs  Lab 07/12/17 0004  WBC 6.7  NEUTROABS 4.2  HGB 15.2  HCT 44.5  MCV 95.1  PLT 179   Cardiac Enzymes: No results for input(s): CKTOTAL, CKMB, CKMBINDEX, TROPONINI in the last 168 hours. BNP: BNP (last 3 results) No results for input(s): BNP in the last 8760 hours.  ProBNP (last 3 results) No results for input(s): PROBNP in the last 8760 hours.  CBG: No results for input(s): GLUCAP in the last 168 hours.     Signed:  Penny Piarlando Josiane Labine MD.  [facility] 07/13/2017, 9:01 AM

## 2018-02-22 ENCOUNTER — Encounter (INDEPENDENT_AMBULATORY_CARE_PROVIDER_SITE_OTHER): Payer: Self-pay | Admitting: Orthopaedic Surgery

## 2018-02-22 ENCOUNTER — Ambulatory Visit (INDEPENDENT_AMBULATORY_CARE_PROVIDER_SITE_OTHER): Payer: 59 | Admitting: Orthopaedic Surgery

## 2018-02-22 ENCOUNTER — Ambulatory Visit (INDEPENDENT_AMBULATORY_CARE_PROVIDER_SITE_OTHER): Payer: 59

## 2018-02-22 DIAGNOSIS — Z96643 Presence of artificial hip joint, bilateral: Secondary | ICD-10-CM

## 2018-02-22 NOTE — Progress Notes (Signed)
The patient is a 62 year old gentleman that we have performed bilateral hip replacements on.  He is walking without a limp he says he has excellent range of motion strength in both hips.  He is 12 months status post a left total hip arthroplasty.  We performed his right hip earlier than that.  He has no issues with his hips at all.  He does have a BMI of just over 40.  He is now retired and working on [employer] loss.  He is riding a mountain bike as well.  On exam he has excellent range of motion of both hips.  He has excellent strength in both hips.  His leg lengths are equal.  At this point he will follow-up as needed.  His x-rays show well-seated implants no complicating features.  He does note that his weight there is a risk of aseptic loosening and implant failure with time.  If he has any issues with his hips at all at any point he will let us know.  All question concerns were answered and addressed.

## 2019-02-22 ENCOUNTER — Ambulatory Visit: Payer: 59 | Attending: Family Medicine | Admitting: Physical Therapy

## 2019-02-22 ENCOUNTER — Encounter: Payer: Self-pay | Admitting: Physical Therapy

## 2019-02-22 ENCOUNTER — Other Ambulatory Visit: Payer: Self-pay

## 2019-02-22 DIAGNOSIS — M62838 Other muscle spasm: Secondary | ICD-10-CM | POA: Insufficient documentation

## 2019-02-22 DIAGNOSIS — M542 Cervicalgia: Secondary | ICD-10-CM | POA: Insufficient documentation

## 2019-02-22 NOTE — Therapy (Signed)
[facility] [address] [place], [place], [address] Phone: [phone]   Fax:  [phone]  Physical Therapy Evaluation  Patient Details  Name: Donald [name] MRN: [id number] Date of Birth: 12/28/1955 Referring Provider (PT): Dr. Farris Has   Encounter Date: 02/22/2019  PT End of Session - 02/22/19 1435    Visit Number  1    Date for PT Re-Evaluation  04/05/19    Authorization Type  [employer]    PT Start Time  1400    PT Stop Time  1435    PT Time Calculation (min)  35 min    Activity Tolerance  Patient tolerated treatment well;No increased pain    Behavior During Therapy  WFL for tasks assessed/performed       Past Medical History:  Diagnosis Date  . Arthritis   . Asthma   . Hyperlipidemia   . Hypertension   . Sleep apnea    cpap settings ? 10     Past Surgical History:  Procedure Laterality Date  . HERNIA REPAIR     x 2   . SINUS EXPLORATION    . TOTAL HIP ARTHROPLASTY Right 03/10/2014   Procedure: RIGHT TOTAL HIP ARTHROPLASTY ANTERIOR APPROACH;  Surgeon: Kathryne Hitch, MD;  Location: [facility];  Service: Orthopedics;  Laterality: Right;  . TOTAL HIP ARTHROPLASTY Left 02/13/2017   Procedure: LEFT TOTAL HIP ARTHROPLASTY ANTERIOR APPROACH;  Surgeon: Kathryne Hitch, MD;  Location: [facility];  Service: Orthopedics;  Laterality: Left;  . URETEROSCOPY     multiple times     There were no vitals filed for this visit.   Subjective Assessment - 02/22/19 1402    Subjective  Patient reports pain started in August with sudden onset. Difficulty with turning head to left.    Patient Stated Goals  reduce neck pain    Currently in Pain?  Yes    Pain Score  8    average 2-3/10   Pain Location  Neck    Pain Orientation  Right    Pain Descriptors / Indicators  Tightness    Pain Type  Acute pain    Pain Radiating Towards  radiate to right shoulder or up to the right side of skull    Pain Onset  More  than a month ago    Pain Frequency  Intermittent    Aggravating Factors   turn to left, sleeping    Pain Relieving Factors  heat         [facility] PT Assessment - 02/22/19 0001      Assessment   Medical Diagnosis  M54.2 Neck pain on right side    Referring Provider (PT)  Dr. Farris Has    Onset Date/Surgical Date  11/27/18    Prior Therapy  None      Precautions   Precautions  None      Restrictions   Weight Bearing Restrictions  No      Home Environment   Living Environment  Private residence      Prior Function   Level of Independence  Independent    Vocation Requirements  desk work      Cognition   Overall Cognitive Status  Within Functional Limits for tasks assessed      Observation/Other Assessments   Focus on Therapeutic Outcomes (FOTO)   48% limitation; goal is 35% limitation      Posture/Postural Control   Posture/Postural Control  Postural limitations  Postural Limitations  Rounded Shoulders;Forward head      ROM / Strength   AROM / PROM / Strength  AROM;PROM;Strength      AROM   AROM Assessment Site  Cervical    Cervical Flexion  55    Cervical Extension  50    Cervical - Right Side Bend  20    Cervical - Left Side Bend  20    Cervical - Right Rotation  45    Cervical - Left Rotation  55      Strength   Overall Strength Comments  bilateral shoulder strength is 5/5                Objective measurements completed on examination: See above findings.      [place] Adult PT Treatment/Exercise - 02/22/19 0001      Manual Therapy   Manual Therapy  Soft tissue mobilization;Joint mobilization    Joint Mobilization  side glide of C2-C6  grade III    Soft tissue mobilization  cervical paraspinals and subocciiptals      Neck Exercises: Stretches   Other Neck Stretches  tennis balls in the subocciptials to massage the muscles             PT Education - 02/22/19 1435    Education Details  Access Code: [email or web address]    Person(s) Educated   Patient    Methods  Explanation;Demonstration;Handout    Comprehension  Returned demonstration;Verbalized understanding       PT Short Term Goals - 02/22/19 1441      PT SHORT TERM GOAL #1   Title  independent with initial HEP    Time  3    Period  Weeks    Status  New    Target Date  03/15/19      PT SHORT TERM GOAL #2   Title  able to turn his head with >/= 25% greater ease due to improved movement    Time  3    Period  Weeks    Status  New    Target Date  03/15/19      PT SHORT TERM GOAL #3   Title  understand ways to sleep with decreased cervical pain    Time  3    Period  Weeks    Status  New    Target Date  03/15/19        PT Long Term Goals - 02/22/19 1442      PT LONG TERM GOAL #1   Title  independent with HEP and understand how to progress himself    Time  6    Period  Weeks    Status  New    Target Date  04/05/19      PT LONG TERM GOAL #2   Title  cervical pain is minimal to no pain when turning his head to the left due to full ROM    Time  6    Period  Weeks    Status  New    Target Date  04/05/19      PT LONG TERM GOAL #3   Title  able to sleep throughout the night without cervical pain waking him up    Time  6    Period  Weeks    Status  New    Target Date  04/05/19      PT LONG TERM GOAL #4   Title  FOTO score </= 35% limitation    Time  6    Period  Weeks    Status  New    Target Date  04/05/19             Plan - 02/22/19 1436    Clinical Impression Statement  Patient is a 63 year old male with cervical pain that started suddenly in 11/2018. Patient reports his pain level ranges from 2-8/10. Patient pain is worse with sleep and turning his head to the left. Patient has limited cervical  ROM for bilateral sidebending and rotation. Patient has decreased mobility of C2-C5 on the right. Patient has tenderness located on the cervical paraspinals and suboccipitals. Patient reports his pain radiates into the right occiput and down into the  right shoulder. Patient will benefit from skilled therapy to improve cervical ROM and reduce pain to improve function.    Personal Factors and Comorbidities  Age;Fitness;Comorbidity 1    Comorbidities  obesity    Examination-Participation Restrictions  Driving    Stability/Clinical Decision Making  Stable/Uncomplicated    Clinical Decision Making  Low    Rehab Potential  Excellent    PT Frequency  2x / week    PT Duration  6 weeks    PT Treatment/Interventions  Cryotherapy;Electrical Stimulation;Moist Heat;Traction;Ultrasound;Therapeutic exercise;Therapeutic activities;Neuromuscular re-education;Patient/family education;Dry needling;Passive range of motion;Manual techniques;Spinal Manipulations    PT Next Visit Plan  cervical mobilization to increase rotation, cervical retraction, dry needling to suboccipitals and paraspinals, cervical stretches    PT Home Exercise Plan  Access Code: [email or web address]    Consulted and Agree with Plan of Care  Patient       Patient will benefit from skilled therapeutic intervention in order to improve the following deficits and impairments:  Decreased range of motion, Increased fascial restricitons, Decreased activity tolerance, Pain, Decreased mobility  Visit Diagnosis: Cervicalgia - Plan: PT plan of care cert/re-cert  Other muscle spasm - Plan: PT plan of care cert/re-cert     Problem List Patient Active Problem List   Diagnosis Date Noted  . Angioedema of lips 07/12/2017  . Hypertension 07/12/2017  . Hyponatremia 07/12/2017  . Psoriasis   . Osteoarthritis of left hip 02/13/2017  . 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 Primary osteoarthritis of right hip 03/10/2014  . Status post total replacement of right hip 03/10/2014  . Diastasis recti 02/21/2013    Eulis Fosterheryl Ashlynne Shetterly, PT 02/22/19 2:46 PM   [facility] [address] [place], [place], [address] Phone: [phone]   Fax:   [phone]  Name: Donald [name] MRN: [id number] Date of Birth: 1956-04-17

## 2019-02-22 NOTE — Patient Instructions (Signed)
Access Code: [email or web address]  URL: [email or web address]  Date: 02/22/2019  Prepared by: Earlie Counts   Exercises Supine Suboccipital Release with Tennis Balls - 10 reps - 1 sets - 1x daily - 7x weekly Seated Assisted Cervical Rotation with Towel - 3 reps - 1 sets - 5 sec hold - 1x daily - 7x weekly [place] [address] [place], [place] [address] Phone # [phone] Fax [phone]

## 2019-02-23 ENCOUNTER — Other Ambulatory Visit: Payer: Self-pay

## 2019-02-23 ENCOUNTER — Encounter: Payer: Self-pay | Admitting: Physical Therapy

## 2019-02-23 ENCOUNTER — Ambulatory Visit: Payer: 59 | Admitting: Physical Therapy

## 2019-02-23 DIAGNOSIS — M62838 Other muscle spasm: Secondary | ICD-10-CM

## 2019-02-23 DIAGNOSIS — M542 Cervicalgia: Secondary | ICD-10-CM

## 2019-02-23 NOTE — Patient Instructions (Signed)
Access Code: [email or web address]  URL: [email or web address]  Date: 02/23/2019  Prepared by: Earlie Counts   Exercises Supine Suboccipital Release with Tennis Balls - 10 reps - 1 sets - 1x daily - 7x weekly Seated Assisted Cervical Rotation with Towel - 3 reps - 1 sets - 5 sec hold - 1x daily - 7x weekly Seated Thoracic Flexion and Rotation with Arms Crossed - 5 reps                   - 1 sets - 5 sec hold - 3x daily - 7x weekly Seated Cervical Retraction - 5 reps - 1 sets - 5 sec hold - 3x daily - 7x weekly Patient Education [facility] [address], [place], [place] [address] Phone # [phone] Fax [phone]

## 2019-02-23 NOTE — Therapy (Signed)
[facility] [address], [address] [place], [place], [address] Phone: [phone]   Fax:  [phone]  Physical Therapy Treatment  Patient Details  Name: Donald SamsDwight E Dinger MRN: [id number] Date of Birth: 1955-05-08 Referring Provider (PT): Dr. Farris HasAaron Morrow   Encounter Date: 02/23/2019  PT End of Session - 02/23/19 1439    Visit Number  2    Date for PT Re-Evaluation  04/05/19    Authorization Type  UHC    PT Start Time  1400    PT Stop Time  1438    PT Time Calculation (min)  38 min    Activity Tolerance  Patient tolerated treatment well;No increased pain    Behavior During Therapy  WFL for tasks assessed/performed       Past Medical History:  Diagnosis Date  . Arthritis   . Asthma   . Hyperlipidemia   . Hypertension   . Sleep apnea    cpap settings ? 10     Past Surgical History:  Procedure Laterality Date  . HERNIA REPAIR     x 2   . SINUS EXPLORATION    . TOTAL HIP ARTHROPLASTY Right 03/10/2014   Procedure: RIGHT TOTAL HIP ARTHROPLASTY ANTERIOR APPROACH;  Surgeon: Kathryne Hitchhristopher Y Blackman, MD;  Location: [facility];  Service: Orthopedics;  Laterality: Right;  . TOTAL HIP ARTHROPLASTY Left 02/13/2017   Procedure: LEFT TOTAL HIP ARTHROPLASTY ANTERIOR APPROACH;  Surgeon: Kathryne HitchBlackman, Christopher Y, MD;  Location: [facility];  Service: Orthopedics;  Laterality: Left;  . URETEROSCOPY     multiple times     There were no vitals filed for this visit.  Subjective Assessment - 02/23/19 1402    Subjective  I feel better than yesterday.    Patient Stated Goals  reduce neck pain    Currently in Pain?  Yes    Pain Score  2     Pain Location  Neck    Pain Orientation  Right    Pain Descriptors / Indicators  Tightness    Pain Type  Acute pain    Pain Radiating Towards  radiate to right shoulder or up to the right side of skull    Pain Onset  More than a month ago    Pain Frequency  Intermittent    Aggravating Factors   turn to  left, sleeping    Pain Relieving Factors  heat    Multiple Pain Sites  No                       [facility] Adult PT Treatment/Exercise - 02/23/19 0001      Manual Therapy   Manual Therapy  Joint mobilization;Soft tissue mobilization    Joint Mobilization  P-A mobilization to T1-T5; upslide glide of C3-C6, C1 side glide    Soft tissue mobilization  cervical paraspinals and subocciiptals; upper trap, interscapular      Neck Exercises: Stretches   Other Neck Stretches  chin retraction hold 5 sec 5 times    Other Neck Stretches  sitting cervical with thoracic flexion to left hold 5 sec 3 times then to the right       [facility] [place] Dry Needling - 02/23/19 0001    Consent Given?  Yes    Education Handout Provided  Yes    Muscles Treated Head and Neck  Suboccipitals;Semispinalis capitus;Upper trapezius    Upper Trapezius Response  Twitch reponse elicited;Palpable increased muscle length    Semispinalis capitus Response  Twitch reponse elicited;Palpable increased muscle length           PT Education - 02/23/19 1437    Education Details  Access Code: [email or web address];  information on dry needling    Person(s) Educated  Patient    Methods  Explanation;Demonstration;Verbal cues;Handout    Comprehension  Verbalized understanding;Returned demonstration       PT Short Term Goals - 02/22/19 1441      PT SHORT TERM GOAL #1   Title  independent with initial HEP    Time  3    Period  Weeks    Status  New    Target Date  03/15/19      PT SHORT TERM GOAL #2   Title  able to turn his head with >/= 25% greater ease due to improved movement    Time  3    Period  Weeks    Status  New    Target Date  03/15/19      PT SHORT TERM GOAL #3   Title  understand ways to sleep with decreased cervical pain    Time  3    Period  Weeks    Status  New    Target Date  03/15/19        PT Long Term Goals - 02/22/19 1442      PT LONG TERM GOAL #1   Title  independent with HEP and  understand how to progress himself    Time  6    Period  Weeks    Status  New    Target Date  04/05/19      PT LONG TERM GOAL #2   Title  cervical pain is minimal to no pain when turning his head to the left due to full ROM    Time  6    Period  Weeks    Status  New    Target Date  04/05/19      PT LONG TERM GOAL #3   Title  able to sleep throughout the night without cervical pain waking him up    Time  6    Period  Weeks    Status  New    Target Date  04/05/19      PT LONG TERM GOAL #4   Title  FOTO score </= 35% limitation    Time  6    Period  Weeks    Status  New    Target Date  04/05/19            Plan - 02/23/19 1404    Clinical Impression Statement  Patient has improved cervical ROM by 50% for rotation after treatment. Patient has tightness still in C1-C3 vertebrae. Patient has responded well with dry needling. Patient has trigger points in the right cervical paraspinals. Patient will benefit from skilled therapy to improve cervical ROM and reduce pain to improve function.    Personal Factors and Comorbidities  Age;Fitness;Comorbidity 1    Comorbidities  obesity    Examination-Participation Restrictions  Driving    Stability/Clinical Decision Making  Stable/Uncomplicated    Rehab Potential  Excellent    PT Frequency  2x / week    PT Duration  6 weeks    PT Treatment/Interventions  Cryotherapy;Electrical Stimulation;Moist Heat;Traction;Ultrasound;Therapeutic exercise;Therapeutic activities;Neuromuscular re-education;Patient/family education;Dry needling;Passive range of motion;Manual techniques;Spinal Manipulations    PT Next Visit Plan  cervical mobilization to increase rotation, cervical retraction, assess dry needling to suboccipitals and paraspinals, cervical stretches  PT Home Exercise Plan  Access Code: [email or web address]    Consulted and Agree with Plan of Care  Patient       Patient will benefit from skilled therapeutic intervention in order to improve the  following deficits and impairments:  Decreased range of motion, Increased fascial restricitons, Decreased activity tolerance, Pain, Decreased mobility  Visit Diagnosis: Cervicalgia  Other muscle spasm     Problem List Patient Active Problem List   Diagnosis Date Noted  . Angioedema of lips 07/12/2017  . Hypertension 07/12/2017  . Hyponatremia 07/12/2017  . Psoriasis   . Osteoarthritis of left hip 02/13/2017  . 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 Primary osteoarthritis of right hip 03/10/2014  . Status post total replacement of right hip 03/10/2014  . Diastasis recti 02/21/2013    Eulis Foster, PT 02/23/19 2:42 PM   [facility] [address], [address] [place], [place], [address] Phone: [phone]   Fax:  [phone]  Name: MERIT MAYBEE MRN: [id number] Date of Birth: 09-12-1955

## 2019-02-28 ENCOUNTER — Ambulatory Visit: Payer: 59 | Attending: Family Medicine | Admitting: Physical Therapy

## 2019-02-28 ENCOUNTER — Other Ambulatory Visit: Payer: Self-pay

## 2019-02-28 DIAGNOSIS — M542 Cervicalgia: Secondary | ICD-10-CM | POA: Diagnosis present

## 2019-02-28 DIAGNOSIS — M62838 Other muscle spasm: Secondary | ICD-10-CM | POA: Insufficient documentation

## 2019-02-28 NOTE — Therapy (Signed)
[facility] [address], [place], [place], [address] Phone: [phone]   Fax:  [phone]  Physical Therapy Treatment  Patient Details  Name: Donald [name] MRN: [id number] Date of Birth: [birth year] Referring Provider (PT): Dr. London Pepper   Encounter Date: 02/28/2019  PT End of Session - 02/28/19 1100    Visit Number  3    Date for PT Re-Evaluation  04/05/19    Authorization Type  U[employer]    PT Start Time  1101    PT Stop Time  1142    PT Time Calculation (min)  41 min    Activity Tolerance  Patient tolerated treatment well;No increased pain    Behavior During Therapy  WFL for tasks assessed/performed       Past Medical History:  Diagnosis Date  . Arthritis   . Asthma   . Hyperlipidemia   . Hypertension   . Sleep apnea    cpap settings ? 10     Past Surgical History:  Procedure Laterality Date  . HERNIA REPAIR     x 2   . SINUS EXPLORATION    . TOTAL HIP ARTHROPLASTY Right 03/10/2014   Procedure: RIGHT TOTAL HIP ARTHROPLASTY ANTERIOR APPROACH;  Surgeon: Mcarthur Rossetti, MD;  Location: [facility];  Service: Orthopedics;  Laterality: Right;  . TOTAL HIP ARTHROPLASTY Left 02/13/2017   Procedure: LEFT TOTAL HIP ARTHROPLASTY ANTERIOR APPROACH;  Surgeon: Mcarthur Rossetti, MD;  Location: [facility];  Service: Orthopedics;  Laterality: Left;  . URETEROSCOPY     multiple times     There were no vitals filed for this visit.  Subjective Assessment - 02/28/19 1103    Subjective  It seems to be getting better.  It was pretty good last night    Patient Stated Goals  reduce neck pain    Currently in Pain?  Yes    Pain Score  4     Pain Location  Neck    Pain Orientation  Right    Pain Descriptors / Indicators  Tightness    Pain Type  Acute pain    Pain Onset  More than a month ago    Pain Frequency  Intermittent                       [facility] Adult PT Treatment/Exercise - 02/28/19 0001       Neck Exercises: Supine   Neck Retraction  10 reps;3 secs    Capital Flexion  10 reps;3 secs    Cervical Rotation  Both;10 reps    Other Supine Exercise  bilateral shoulder ER - red band - 10x 3 sec hold      Manual Therapy   Joint Mobilization  C1 side glide    Soft tissue mobilization  cervical paraspinals and subocciiptals; upper trap, interscapular       Trigger Point Dry Needling - 02/28/19 0001    Consent Given?  Yes    Education Handout Provided  Previously provided    Muscles Treated Head and Neck  Suboccipitals;Semispinalis capitus;Upper trapezius    Upper Trapezius Response  Twitch reponse elicited;Palpable increased muscle length    Semispinalis capitus Response  Twitch reponse elicited;Palpable increased muscle length           PT Education - 02/28/19 1146    Education Details  Access Code: [email or web address] URL: [email or web address] Date: 02/28/2019 Prepared by: [name]  Exercises Supine Suboccipital Release with Tennis Balls -  10 reps - 1 sets - 1x daily - 7x weekly Seated Assisted Cervical Rotation with Towel - 3 reps - 1 sets - 5 sec hold - 1x daily - 7x weekly Seated Thoracic Flexion and Rotation with Arms Crossed - 5 reps - 1 sets - 5 sec hold - 3x daily - 7x weekly Seated Cervical Retraction - 5 reps - 1 sets - 5 sec hold - 3x daily - 7x weekly Supine Chin Tuck - 10 reps - 1 sets - 3 sec hold - 1x daily - 7x weekly Supine Bilateral Shoulder External Rotation with Resistance around Wrists - 10 reps - 2 sets - 1x daily - 7x weekly Patient Education .Trigger Point Dry Needling    Person(s) Educated  Patient    Methods  Explanation;Demonstration;Verbal cues    Comprehension  Verbalized understanding;Returned demonstration       PT Short Term Goals - 02/28/19 1137      PT SHORT TERM GOAL #1   Title  independent with initial HEP    Status  Achieved      PT SHORT TERM GOAL #2   Title  able to turn his head with >/= 25% greater ease due to  improved movement    Baseline  at least 25%    Status  Achieved        PT Long Term Goals - 02/22/19 1442      PT LONG TERM GOAL #1   Title  independent with HEP and understand how to progress himself    Time  6    Period  Weeks    Status  New    Target Date  04/05/19      PT LONG TERM GOAL #2   Title  cervical pain is minimal to no pain when turning his head to the left due to full ROM    Time  6    Period  Weeks    Status  New    Target Date  04/05/19      PT LONG TERM GOAL #3   Title  able to sleep throughout the night without cervical pain waking him up    Time  6    Period  Weeks    Status  New    Target Date  04/05/19      PT LONG TERM GOAL #4   Title  FOTO score </= 35% limitation    Time  6    Period  Weeks    Status  New    Target Date  04/05/19            Plan - 02/28/19 1138    Clinical Impression Statement  Pt was able to initiate strengthening with cervical retractions in supine today.  He is still feeling better and feeling like his cervical rotation is more than 25% improved.  Pt had muscle tension throughout Rt cervical paraspinals and upper trap.  Pt felt less pain after treatment today.  He continues to have a little tension in C1-3.  He will benefit from skilled PT to continue to improve ROM and postural strength.    PT Treatment/Interventions  Cryotherapy;Electrical Stimulation;Moist Heat;Traction;Ultrasound;Therapeutic exercise;Therapeutic activities;Neuromuscular re-education;Patient/family education;Dry needling;Passive range of motion;Manual techniques;Spinal Manipulations    PT Next Visit Plan  f/u on HEP, cervical mobilization to increase rotation, cervical retraction, assess dry needling to suboccipitals and paraspinals, cervical stretches    PT Home Exercise Plan  Access Code: [email or web address]    Consulted and Agree with Plan of  Care  Patient       Patient will benefit from skilled therapeutic intervention in order to improve the following  deficits and impairments:  Decreased range of motion, Increased fascial restricitons, Decreased activity tolerance, Pain, Decreased mobility  Visit Diagnosis: Cervicalgia  Other muscle spasm     Problem List Patient Active Problem List   Diagnosis Date Noted  . Angioedema of lips 07/12/2017  . Hypertension 07/12/2017  . Hyponatremia 07/12/2017  . Psoriasis   . Osteoarthritis of left hip 02/13/2017  . 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 Primary osteoarthritis of right hip 03/10/2014  . Status post total replacement of right hip 03/10/2014  . Diastasis recti 02/21/2013    Junious Silk, PT 02/28/2019, 2:49 PM  [facility] [address] [place], [place], [address] Phone: [phone]   Fax:  [phone]  Name: LORCAN SHELP MRN: [id number] Date of Birth: May 07, 1955

## 2019-02-28 NOTE — Patient Instructions (Signed)
Access Code: [email or web address]  URL: [email or web address]  Date: 02/28/2019  Prepared by: [name]   Exercises  Supine Suboccipital Release with Tennis Balls - 10 reps - 1 sets - 1x daily - 7x weekly  Seated Assisted Cervical Rotation with Towel - 3 reps - 1 sets - 5 sec hold - 1x daily - 7x weekly  Seated Thoracic Flexion and Rotation with Arms Crossed - 5 reps - 1 sets - 5 sec hold - 3x daily - 7x weekly  Seated Cervical Retraction - 5 reps - 1 sets - 5 sec hold - 3x daily - 7x weekly  Supine Chin Tuck - 10 reps - 1 sets - 3 sec hold - 1x daily - 7x weekly  Supine Bilateral Shoulder External Rotation with Resistance around Wrists - 10 reps - 2 sets - 1x daily - 7x weekly  Patient Education  Trigger Point Dry Needling

## 2019-03-03 ENCOUNTER — Ambulatory Visit: Payer: 59 | Admitting: Physical Therapy

## 2019-03-03 ENCOUNTER — Other Ambulatory Visit: Payer: Self-pay

## 2019-03-03 ENCOUNTER — Encounter: Payer: Self-pay | Admitting: Physical Therapy

## 2019-03-03 DIAGNOSIS — M542 Cervicalgia: Secondary | ICD-10-CM

## 2019-03-03 DIAGNOSIS — M62838 Other muscle spasm: Secondary | ICD-10-CM

## 2019-03-03 NOTE — Therapy (Signed)
[facility] [address], [address] [place], [place], [address] Phone: [phone]   Fax:  [phone]  Physical Therapy Treatment  Patient Details  Name: Donald [name] MRN: [id number] Date of Birth: 09-10-1955 Referring Provider (PT): Dr. Farris HasAaron Morrow   Encounter Date: 03/03/2019  PT End of Session - 03/03/19 1450    Visit Number  4    Date for PT Re-Evaluation  04/05/19    PT Start Time  1401    PT Stop Time  1441    PT Time Calculation (min)  40 min    Activity Tolerance  Patient tolerated treatment well;No increased pain    Behavior During Therapy  WFL for tasks assessed/performed       Past Medical History:  Diagnosis Date  . Arthritis   . Asthma   . Hyperlipidemia   . Hypertension   . Sleep apnea    cpap settings ? 10     Past Surgical History:  Procedure Laterality Date  . HERNIA REPAIR     x 2   . SINUS EXPLORATION    . TOTAL HIP ARTHROPLASTY Right 03/10/2014   Procedure: RIGHT TOTAL HIP ARTHROPLASTY ANTERIOR APPROACH;  Surgeon: Kathryne Hitchhristopher Y Blackman, MD;  Location: [facility];  Service: Orthopedics;  Laterality: Right;  . TOTAL HIP ARTHROPLASTY Left 02/13/2017   Procedure: LEFT TOTAL HIP ARTHROPLASTY ANTERIOR APPROACH;  Surgeon: Kathryne HitchBlackman, Christopher Y, MD;  Location: [facility];  Service: Orthopedics;  Laterality: Left;  . URETEROSCOPY     multiple times     There were no vitals filed for this visit.  Subjective Assessment - 03/03/19 1455    Subjective  I had one day where I had no pain.  I don't have much pain today.  I can turn farther to the left but slowly.    Patient Stated Goals  reduce neck pain    Currently in Pain?  Yes    Pain Score  1     Pain Location  Neck    Pain Orientation  Right    Pain Descriptors / Indicators  Tightness    Pain Type  Acute pain    Pain Onset  More than a month ago    Pain Frequency  Intermittent                       [facility] Adult PT  Treatment/Exercise - 03/03/19 0001      Neck Exercises: Supine   Shoulder ABduction  Both;20 reps    Upper Extremity D1  Theraband;15 reps;Extension    Theraband Level (UE D1)  Level 2 (Red)    Other Supine Exercise  sidelying thoracic rotation - some pain rotating to the left; throracic rotation with flexion - no pain in sitting - 5 x each way      Lumbar Exercises: Supine   Bent Knee Raise  10 reps;3 seconds      Manual Therapy   Joint Mobilization  C1 side glide    Soft tissue mobilization  cervical paraspinals and subocciptals; upper trap, interscapular             PT Education - 03/03/19 1450    Education Details  Access Code: [email or web address]    Person(s) Educated  Patient    Methods  Explanation;Demonstration;Handout;Verbal cues;Tactile cues    Comprehension  Verbalized understanding;Returned demonstration       PT Short Term Goals - 02/28/19 1137      PT SHORT TERM  GOAL #1   Title  independent with initial HEP    Status  Achieved      PT SHORT TERM GOAL #2   Title  able to turn his head with >/= 25% greater ease due to improved movement    Baseline  at least 25%    Status  Achieved        PT Long Term Goals - 02/22/19 1442      PT LONG TERM GOAL #1   Title  independent with HEP and understand how to progress himself    Time  6    Period  Weeks    Status  New    Target Date  04/05/19      PT LONG TERM GOAL #2   Title  cervical pain is minimal to no pain when turning his head to the left due to full ROM    Time  6    Period  Weeks    Status  New    Target Date  04/05/19      PT LONG TERM GOAL #3   Title  able to sleep throughout the night without cervical pain waking him up    Time  6    Period  Weeks    Status  New    Target Date  04/05/19      PT LONG TERM GOAL #4   Title  FOTO score </= 35% limitation    Time  6    Period  Weeks    Status  New    Target Date  04/05/19            Plan - 03/03/19 1554    Clinical Impression Statement   Pt had a pain free day after PT this week.  Minimal pain today.  Pt was able to progress band exercises and add to HEP.  He has stiffness in thoracic spine and pain with thoracic extension.  pain in ribcage is illicited when lying supine without pillow under his knees, so core strengthing added for improved trunk stability.  Pt responded well to STM. He will continue to benefit from skilled PT to posture strengthening and increased mobility.    PT Treatment/Interventions  Cryotherapy;Electrical Stimulation;Moist Heat;Traction;Ultrasound;Therapeutic exercise;Therapeutic activities;Neuromuscular re-education;Patient/family education;Dry needling;Passive range of motion;Manual techniques;Spinal Manipulations    PT Next Visit Plan  f/u on HEP, cervical mobilization to increase rotation, cervical retraction, assess dry needling to suboccipitals and paraspinals, cervical stretches    PT Home Exercise Plan  Access Code: [email or web address]    Recommended Other Services  cert is signed    Consulted and Agree with Plan of Care  Patient       Patient will benefit from skilled therapeutic intervention in order to improve the following deficits and impairments:  Decreased range of motion, Increased fascial restricitons, Decreased activity tolerance, Pain, Decreased mobility  Visit Diagnosis: Cervicalgia  Other muscle spasm     Problem List Patient Active Problem List   Diagnosis Date Noted  . Angioedema of lips 07/12/2017  . Hypertension 07/12/2017  . Hyponatremia 07/12/2017  . Psoriasis   . Osteoarthritis of left hip 02/13/2017  . 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 Primary osteoarthritis of right hip 03/10/2014  . Status post total replacement of right hip 03/10/2014  . Diastasis recti 02/21/2013    Junious Silk, PT 03/03/2019, 3:59 PM  [facility] [address] [place], [place], [address] Phone: [phone]  Fax:  [phone]  Name: Donald [name] MRN: [id number] Date of Birth: 1955/08/13

## 2019-03-03 NOTE — Patient Instructions (Signed)
Access Code: [email or web address]  URL: [email or web address]  Date: 03/03/2019  Prepared by: [name]   Exercises  Supine Suboccipital Release with Tennis Balls - 10 reps - 1 sets - 1x daily - 7x weekly  Seated Assisted Cervical Rotation with Towel - 3 reps - 1 sets - 5 sec hold - 1x daily - 7x weekly  Seated Thoracic Flexion and Rotation with Arms Crossed - 5 reps - 1 sets - 5 sec hold - 3x daily - 7x weekly  Seated Cervical Retraction - 5 reps - 1 sets - 5 sec hold - 3x daily - 7x weekly  Supine Chin Tuck - 10 reps - 1 sets - 3 sec hold - 1x daily - 7x weekly  Supine Bilateral Shoulder External Rotation with Resistance around Wrists - 10 reps - 2 sets - 1x daily - 7x weekly  Supine Shoulder Horizontal Abduction with Resistance - 10 reps - 2 sets - 1x daily - 7x weekly  Seated Upper Extremity Diagonals with Resistance on Swiss Ball - 10 reps - 2 sets - 1x daily - 7x weekly  Patient Education  Trigger Point Dry Needling

## 2019-03-04 ENCOUNTER — Encounter

## 2019-03-07 ENCOUNTER — Ambulatory Visit (INDEPENDENT_AMBULATORY_CARE_PROVIDER_SITE_OTHER): Payer: 59 | Admitting: Orthopaedic Surgery

## 2019-03-07 ENCOUNTER — Encounter: Payer: Self-pay | Admitting: Orthopaedic Surgery

## 2019-03-07 ENCOUNTER — Other Ambulatory Visit: Payer: Self-pay

## 2019-03-07 DIAGNOSIS — M25562 Pain in left knee: Secondary | ICD-10-CM

## 2019-03-07 NOTE — Progress Notes (Signed)
The patient is very well-known to me.  He comes in today after 4 to 6 weeks of left knee pain.  He does workout on a treadmill and he feels like the knee has given out when he was on the treadmill.  He said the knee is no longer hurting since yesterday.  He denies any swelling.  He was afraid to cancel appointment though.  He does have a history of psoriasis.  We have replaced his left hip 2 years ago.  He is someone who weighs 282 pounds.  On examination of his knees he does actually have a mild effusion of his left knee that is the painful knee when you compare the left and right knees.  Is also slightly warm compared to the other side.  His range of motion is full.  He has no joint line tenderness and his McMurray's and ligamentous exams are normal today.  His range of motion is full.  He is essentially pain-free.  He does have a history of psoriasis but no flareup recently.  Since he is asymptomatic today we are not clinic treat him other than just conservative with him getting back on the treadmill as comfort allows.  He will avoid pivoting activities.  I am concerned that something did happen to his knee because there is a mild to moderate effusion and a slight warmth.  He understands that if it continues to give him problems or flares up again I want to see him back in x-ray of the knee as well as aspirate and inject the knee.  All question concerns were answered and addressed.

## 2019-03-10 ENCOUNTER — Other Ambulatory Visit: Payer: Self-pay

## 2019-03-10 ENCOUNTER — Ambulatory Visit: Payer: 59 | Admitting: Physical Therapy

## 2019-03-10 ENCOUNTER — Encounter: Payer: Self-pay | Admitting: Physical Therapy

## 2019-03-10 DIAGNOSIS — M62838 Other muscle spasm: Secondary | ICD-10-CM

## 2019-03-10 DIAGNOSIS — M542 Cervicalgia: Secondary | ICD-10-CM

## 2019-03-10 NOTE — Therapy (Signed)
[facility] [address], [address] [place], [place], [address] Phone: [phone]   Fax:  [phone]  Physical Therapy Treatment  Patient Details  Name: Donald [name] MRN: [id number] Date of Birth: May 17, 1955 Referring Provider (PT): Dr. Farris HasAaron Morrow   Encounter Date: 03/10/2019  PT End of Session - 03/10/19 1142    Visit Number  5    Date for PT Re-Evaluation  04/05/19    Authorization Type  UHC    PT Start Time  1100    PT Stop Time  1140    PT Time Calculation (min)  40 min    Activity Tolerance  Patient tolerated treatment well;No increased pain    Behavior During Therapy  WFL for tasks assessed/performed       Past Medical History:  Diagnosis Date  . Arthritis   . Asthma   . Hyperlipidemia   . Hypertension   . Sleep apnea    cpap settings ? 10     Past Surgical History:  Procedure Laterality Date  . HERNIA REPAIR     x 2   . SINUS EXPLORATION    . TOTAL HIP ARTHROPLASTY Right 03/10/2014   Procedure: RIGHT TOTAL HIP ARTHROPLASTY ANTERIOR APPROACH;  Surgeon: Kathryne Hitchhristopher Y Blackman, MD;  Location: [facility];  Service: Orthopedics;  Laterality: Right;  . TOTAL HIP ARTHROPLASTY Left 02/13/2017   Procedure: LEFT TOTAL HIP ARTHROPLASTY ANTERIOR APPROACH;  Surgeon: Kathryne HitchBlackman, Christopher Y, MD;  Location: [facility];  Service: Orthopedics;  Laterality: Left;  . URETEROSCOPY     multiple times     There were no vitals filed for this visit.  Subjective Assessment - 03/10/19 1103    Subjective  My neck is feeling 95% better. Most part of the day I have no pain and just get some twinges here and there. The pain is more intermittent.    Patient Stated Goals  reduce neck pain    Currently in Pain?  Yes    Pain Score  2     Pain Location  Neck    Pain Orientation  Right    Pain Descriptors / Indicators  Tightness    Pain Type  Acute pain    Pain Onset  More than a month ago    Pain Frequency  Intermittent    Aggravating Factors   turning head to the left, sleeping    Pain Relieving Factors  heat    Multiple Pain Sites  No         [facility] PT Assessment - 03/10/19 0001      Assessment   Medical Diagnosis  M54.2 Neck pain on right side    Referring Provider (PT)  Dr. Farris HasAaron Morrow    Onset Date/Surgical Date  11/27/18    Prior Therapy  None      AROM   Cervical - Right Rotation  85    Cervical - Left Rotation  85                   [facility] Adult PT Treatment/Exercise - 03/10/19 0001      Therapeutic Activites    Therapeutic Activities  Other Therapeutic Activities    Other Therapeutic Activities  sleeping on back and side using a towel roll to support the cervical curve      Neck Exercises: Machines for Strengthening   UBE (Upper Arm Bike)  2 minutes forward and 2 minutes backward at 4.4 RPM      Neck  Exercises: Prone   Other Prone Exercise  prone on elbows with chin and head retraction working on the cervical extensors 10x hold 5 seconds    Other Prone Exercise  prone on elbows with chin retraction moving arms forward and back with tactile cues from therapist      Lumbar Exercises: Supine   Other Supine Lumbar Exercises  lay on side and bring arm out to the side and rotate cervical 10x each side      Manual Therapy   Manual Therapy  Soft tissue mobilization    Soft tissue mobilization  cervical suboccipitals, cervical paraspinals, bilateral interscapular area and sides of T1-T3       Trigger Point Dry Needling - 03/10/19 0001    Consent Given?  Yes    Education Handout Provided  Previously provided    Muscles Treated Head and Neck  Suboccipitals;Upper trapezius;Cervical multifidi    Muscles Treated Upper Quadrant  Rhomboids   right   Other Dry Needling  bil. sides of T1-T3    Upper Trapezius Response  --    Semispinalis capitus Response  --    Cervical multifidi Response  Twitch reponse elicited;Palpable increased muscle length             PT Short Term  Goals - 03/10/19 1106      PT SHORT TERM GOAL #3   Title  understand ways to sleep with decreased cervical pain    Time  3    Period  Weeks    Status  Achieved    Target Date  03/15/19        PT Long Term Goals - 03/10/19 1106      PT LONG TERM GOAL #1   Title  independent with HEP and understand how to progress himself    Time  6    Period  Weeks    Status  On-going      PT LONG TERM GOAL #2   Title  cervical pain is minimal to no pain when turning his head to the left due to full ROM    Time  6    Period  Weeks    Status  On-going      PT LONG TERM GOAL #3   Title  able to sleep throughout the night without cervical pain waking him up    Time  6    Period  Weeks    Status  On-going            Plan - 03/10/19 1143    Clinical Impression Statement  Patient now has full cervical rotation. Patient has tigtness on the sides of C7, left subocipitals, right rhomboids, right side of C6. Patient is working more on cervical stability in pone. Patient was able to do the UBE without increased pain. Patient will benefit from skilled therapy to improve postural strength and increase mobility.    Personal Factors and Comorbidities  Age;Fitness;Comorbidity 1    Comorbidities  obesity    Examination-Participation Restrictions  Driving    Stability/Clinical Decision Making  Stable/Uncomplicated    Rehab Potential  Excellent    PT Frequency  2x / week    PT Duration  6 weeks    PT Treatment/Interventions  Cryotherapy;Electrical Stimulation;Moist Heat;Traction;Ultrasound;Therapeutic exercise;Therapeutic activities;Neuromuscular re-education;Patient/family education;Dry needling;Passive range of motion;Manual techniques;Spinal Manipulations    PT Next Visit Plan  continue with dry needling one time per week, prone cervical stabilization exercise, soft tissue work    PT Home Exercise Plan  Access Code: [email or web address]    Consulted and Agree with Plan of Care  Patient       Patient will  benefit from skilled therapeutic intervention in order to improve the following deficits and impairments:  Decreased range of motion, Increased fascial restricitons, Decreased activity tolerance, Pain, Decreased mobility  Visit Diagnosis: Cervicalgia  Other muscle spasm     Problem List Patient Active Problem List   Diagnosis Date Noted  . Angioedema of lips 07/12/2017  . Hypertension 07/12/2017  . Hyponatremia 07/12/2017  . Psoriasis   . Osteoarthritis of left hip 02/13/2017  . 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 Primary osteoarthritis of right hip 03/10/2014  . Status post total replacement of right hip 03/10/2014  . Diastasis recti 02/21/2013    Earlie Counts, PT 03/10/19 11:46 AM   [facility] [address], [place], [place], [address] Phone: [phone]   Fax:  [phone]  Name: Donald [name] MRN: [id number] Date of Birth: [birth year]

## 2019-03-14 ENCOUNTER — Other Ambulatory Visit: Payer: Self-pay

## 2019-03-14 ENCOUNTER — Ambulatory Visit: Payer: 59 | Admitting: Physical Therapy

## 2019-03-14 ENCOUNTER — Encounter: Payer: Self-pay | Admitting: Physical Therapy

## 2019-03-14 DIAGNOSIS — M542 Cervicalgia: Secondary | ICD-10-CM | POA: Diagnosis not present

## 2019-03-14 DIAGNOSIS — M62838 Other muscle spasm: Secondary | ICD-10-CM

## 2019-03-14 NOTE — Therapy (Signed)
[facility] [address] [place], [place], [address] Phone: [phone]   Fax:  [phone]  Physical Therapy Treatment  Patient Details  Name: Donald [name] MRN: [id number] Date of Birth: [birth year] Referring Provider (PT): Dr. Farris HasAaron Morrow   Encounter Date: 03/14/2019  PT End of Session - 03/14/19 1214    Visit Number  6    Date for PT Re-Evaluation  04/05/19    Authorization Type  UHC    PT Start Time  1145    PT Stop Time  1223    PT Time Calculation (min)  38 min    Activity Tolerance  Patient tolerated treatment well;No increased pain    Behavior During Therapy  WFL for tasks assessed/performed       Past Medical History:  Diagnosis Date  . Arthritis   . Asthma   . Hyperlipidemia   . Hypertension   . Sleep apnea    cpap settings ? 10     Past Surgical History:  Procedure Laterality Date  . HERNIA REPAIR     x 2   . SINUS EXPLORATION    . TOTAL HIP ARTHROPLASTY Right 03/10/2014   Procedure: RIGHT TOTAL HIP ARTHROPLASTY ANTERIOR APPROACH;  Surgeon: Kathryne Hitchhristopher Y Blackman, MD;  Location: [facility];  Service: Orthopedics;  Laterality: Right;  . TOTAL HIP ARTHROPLASTY Left 02/13/2017   Procedure: LEFT TOTAL HIP ARTHROPLASTY ANTERIOR APPROACH;  Surgeon: Kathryne HitchBlackman, Christopher Y, MD;  Location: [facility];  Service: Orthopedics;  Laterality: Left;  . URETEROSCOPY     multiple times     There were no vitals filed for this visit.  Subjective Assessment - 03/14/19 1152    Subjective  I had a painfree day when I left from therapy. Pain does not radiate into the right shoulder only into the skull.    Patient Stated Goals  reduce neck pain    Currently in Pain?  Yes    Pain Score  3     Pain Location  Neck    Pain Orientation  Right    Pain Descriptors / Indicators  Tightness    Pain Type  Acute pain    Pain Radiating Towards  only into the skull    Pain Onset  More than a month ago    Pain Frequency   Intermittent    Aggravating Factors   turning head to the left, sleeping    Pain Relieving Factors  heat                       [facility] Adult PT Treatment/Exercise - 03/14/19 0001      Neck Exercises: Prone   Rows  10 reps    Rows Weights (lbs)  bil. shoulder horizontal abduction    Upper Extremity Flexion with Stabilization  Flexion;20 reps    Plank  prone on elbows and lift arm with turning head 10 times each side    Other Prone Exercise  prone on elbows with chin and head retraction working on the cervical extensors 10x hold 5 seconds    Other Prone Exercise  prone on elbows with chin retraction moving arms forward and back with tactile cues from therapist      Manual Therapy   Manual Therapy  Soft tissue mobilization    Soft tissue mobilization  cervical suboccipitals, cervical paraspinals, bilateral interscapular area and sides of T1-T3       Trigger Point Dry Needling - 03/14/19 0001  Consent Given?  Yes    Education Handout Provided  Previously provided    Muscles Treated Head and Neck  Suboccipitals;Cervical multifidi    Muscles Treated Upper Quadrant  Rhomboids   right   Other Dry Needling  bil. sides of T1-T3    Semispinalis capitus Response  Twitch reponse elicited;Palpable increased muscle length    Cervical multifidi Response  Twitch reponse elicited;Palpable increased muscle length           PT Education - 03/14/19 1221    Education Details  Access Code: [email or web address]    Person(s) Educated  Patient    Methods  Explanation;Demonstration;Verbal cues;Handout    Comprehension  Verbalized understanding;Returned demonstration       PT Short Term Goals - 03/10/19 1106      PT SHORT TERM GOAL #3   Title  understand ways to sleep with decreased cervical pain    Time  3    Period  Weeks    Status  Achieved    Target Date  03/15/19        PT Long Term Goals - 03/14/19 1226      PT LONG TERM GOAL #1   Title  independent with HEP and understand  how to progress himself    Time  6    Period  Weeks    Status  On-going      PT LONG TERM GOAL #2   Title  cervical pain is minimal to no pain when turning his head to the left due to full ROM    Time  6    Period  Weeks    Status  On-going      PT LONG TERM GOAL #3   Title  able to sleep throughout the night without cervical pain waking him up    Baseline  still working on pillow    Time  6    Period  Weeks    Status  On-going            Plan - 03/14/19 1218    Clinical Impression Statement  Patient was able to have one day without pain. Patient is looking into different pillows to sleep with. Patient is working on interscapular strength and cervical retraction. Patient has full cervical rotation to the left but slight pain at endrange. Patient had less trigger points in the subocciptials. Patient was able to do the UBE without pain. Patient will benefit from skilled therapy to improve postural strength and increase mobility.    Personal Factors and Comorbidities  Age;Fitness;Comorbidity 1    Comorbidities  obesity    Examination-Participation Restrictions  Driving    Stability/Clinical Decision Making  Stable/Uncomplicated    Rehab Potential  Excellent    PT Frequency  2x / week    PT Duration  6 weeks    PT Treatment/Interventions  Cryotherapy;Electrical Stimulation;Moist Heat;Traction;Ultrasound;Therapeutic exercise;Therapeutic activities;Neuromuscular re-education;Patient/family education;Dry needling;Passive range of motion;Manual techniques;Spinal Manipulations    PT Next Visit Plan  continue with dry needling one time per week, prone cervical stabilization exercise, soft tissue work    PT Home Exercise Plan  Access Code: [email or web address]    Consulted and Agree with Plan of Care  Patient       Patient will benefit from skilled therapeutic intervention in order to improve the following deficits and impairments:  Decreased range of motion, Increased fascial restricitons,  Decreased activity tolerance, Pain, Decreased mobility  Visit Diagnosis: Cervicalgia  Other muscle spasm     Problem List  Patient Active Problem List   Diagnosis Date Noted  . Angioedema of lips 07/12/2017  . Hypertension 07/12/2017  . Hyponatremia 07/12/2017  . Psoriasis   . Osteoarthritis of left hip 02/13/2017  . 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 Primary osteoarthritis of right hip 03/10/2014  . Status post total replacement of right hip 03/10/2014  . Diastasis recti 02/21/2013    Eulis Foster, PT 03/14/19 12:28 PM   [facility] [address] [place], [place], [address] Phone: [phone]   Fax:  [phone]  Name: Donald [name] MRN: [id number] Date of Birth: Feb 02, 1956

## 2019-03-14 NOTE — Patient Instructions (Signed)
Access Code: [email or web address]  URL: [email or web address]  Date: 03/14/2019  Prepared by: Earlie Counts   Exercises Supine Suboccipital Release with Tennis Balls - 10 reps - 1 sets - 1x daily - 7x weekly Seated Assisted Cervical Rotation with Towel - 3 reps - 1 sets - 5 sec hold - 1x daily - 7x weekly Seated Thoracic Flexion and Rotation with Arms Crossed - 5 reps                   - 1 sets - 5 sec hold - 3x daily - 7x weekly Seated Cervical Retraction - 5 reps - 1 sets - 5 sec hold - 3x daily - 7x weekly Supine Chin Tuck - 10 reps - 1 sets - 3 sec hold - 1x daily - 7x weekly Supine Bilateral Shoulder External Rotation with Resistance around Wrists - 10 reps - 2 sets - 1x daily - 7x weekly Supine Shoulder Horizontal Abduction with Resistance - 10 reps - 2 sets - 1x daily - 7x weekly Seated Upper Extremity Diagonals with Resistance on Swiss Ball - 10 reps - 2 sets - 1x daily - 7x weekly Prone Shoulder Flexion - 10 reps - 1 sets - 1x daily - 7x weekly Prone Shoulder Horizontal Abduction with Thumbs Up - 10 reps - 1 sets - 1x daily - 7x weekly Cervical Retraction Prone on Elbows - 10 reps - 1 sets - 5 sec hold - 1x daily - 7x weekly Prone on Elbows Thoracic Rotation - 10 reps - 1 sets - 1x daily - 7x weekly Patient Education Trigger [facility] Dry Needling [facility] [address] [place], [place] [address] Phone # [phone] Fax [phone]

## 2019-03-16 ENCOUNTER — Other Ambulatory Visit: Payer: Self-pay

## 2019-03-16 ENCOUNTER — Ambulatory Visit: Payer: 59 | Admitting: Physical Therapy

## 2019-03-16 ENCOUNTER — Encounter: Payer: Self-pay | Admitting: Physical Therapy

## 2019-03-16 DIAGNOSIS — M542 Cervicalgia: Secondary | ICD-10-CM

## 2019-03-16 DIAGNOSIS — M62838 Other muscle spasm: Secondary | ICD-10-CM

## 2019-03-16 NOTE — Therapy (Signed)
[facility] [address] [place], [place], [address] Phone: [phone]   Fax:  [phone]  Physical Therapy Treatment  Patient Details  Name: Donald [name] MRN: [id number] Date of Birth: Jan 24, 1956 Referring Provider (PT): Dr. London Pepper   Encounter Date: 03/16/2019  PT End of Session - 03/16/19 1406    Visit Number  7    Date for PT Re-Evaluation  04/05/19    Authorization Type  UHC    PT Start Time  1400    PT Stop Time  1438    PT Time Calculation (min)  38 min    Activity Tolerance  Patient tolerated treatment well;No increased pain    Behavior During Therapy  WFL for tasks assessed/performed       Past Medical History:  Diagnosis Date  . Arthritis   . Asthma   . Hyperlipidemia   . Hypertension   . Sleep apnea    cpap settings ? 10     Past Surgical History:  Procedure Laterality Date  . HERNIA REPAIR     x 2   . SINUS EXPLORATION    . TOTAL HIP ARTHROPLASTY Right 03/10/2014   Procedure: RIGHT TOTAL HIP ARTHROPLASTY ANTERIOR APPROACH;  Surgeon: Mcarthur Rossetti, MD;  Location: [facility];  Service: Orthopedics;  Laterality: Right;  . TOTAL HIP ARTHROPLASTY Left 02/13/2017   Procedure: LEFT TOTAL HIP ARTHROPLASTY ANTERIOR APPROACH;  Surgeon: Mcarthur Rossetti, MD;  Location: [facility];  Service: Orthopedics;  Laterality: Left;  . URETEROSCOPY     multiple times     There were no vitals filed for this visit.  Subjective Assessment - 03/16/19 1402    Subjective  The pain is almost gone. I feel a catch and that is. The pain is not consistent.    Patient Stated Goals  reduce neck pain    Currently in Pain?  Yes    Pain Score  1     Pain Location  Neck    Pain Descriptors / Indicators  Tightness    Pain Type  Acute pain    Pain Onset  More than a month ago    Pain Frequency  Intermittent    Aggravating Factors   sleeping, turning to left    Pain Relieving Factors  heat    Multiple  Pain Sites  No                       [facility] Adult PT Treatment/Exercise - 03/16/19 0001      Neck Exercises: Machines for Strengthening   UBE (Upper Arm Bike)  2 minutes forward and 2 minutes backward at 5 RPM   seat #10     Neck Exercises: Standing   Wall Push Ups  15 reps    Other Standing Exercises  standing shoulder row with chin tuck 4# bil.     Other Standing Exercises  horizontal shoulder abduction green band 15x      Neck Exercises: Supine   Neck Retraction  5 reps;5 secs    Neck Retraction Limitations  with head off the mat    Cervical Rotation  Both;5 reps   A/AROM   Other Supine Exercise  melt with the foam roll 10x each way      Neck Exercises: Prone   Upper Extremity Flexion with Stabilization  Flexion;20 reps    UE Flexion with Stabilization Limitations  on elbows with chin retraction    Plank  prone on elbows and lift arm with turning head 10 times each side    Other Prone Exercise  prone on elbows with chin and head retraction working on the cervical extensors 10x hold 5 seconds    Other Prone Exercise  prone on elbows with chin retraction moving arms forward and back with tactile cues from therapist      Shoulder Exercises: Seated   Abduction  Strengthening;Both;10 reps    ABduction Weight (lbs)  4#    ABduction Limitations  just overhead    Other Seated Exercises  1# each hand ringing weight overhead with rotation 10x each way      Manual Therapy   Manual Therapy  Soft tissue mobilization    Manual therapy comments  chin retraction 5 times; chin retraction with extension 5x, chin retraction with extenion then wiggle 5 x    Soft tissue mobilization  deep soft tissue work to subocciptials               PT Short Term Goals - 03/10/19 1106      PT SHORT TERM GOAL #3   Title  understand ways to sleep with decreased cervical pain    Time  3    Period  Weeks    Status  Achieved    Target Date  03/15/19        PT Long Term Goals -  03/14/19 1226      PT LONG TERM GOAL #1   Title  independent with HEP and understand how to progress himself    Time  6    Period  Weeks    Status  On-going      PT LONG TERM GOAL #2   Title  cervical pain is minimal to no pain when turning his head to the left due to full ROM    Time  6    Period  Weeks    Status  On-going      PT LONG TERM GOAL #3   Title  able to sleep throughout the night without cervical pain waking him up    Baseline  still working on pillow    Time  6    Period  Weeks    Status  On-going            Plan - 03/16/19 1407    Clinical Impression Statement  Patient pain is more intermittent. Patient continues to have pain with cervical rotation and sleeping. Patient was able to turn head without pain after therapy today. Patient is working more on strength today. At end of treatment patient had full cervical rotation. Patient will benefit from skilled therapy to improve postural strength and increase mobility.    Personal Factors and Comorbidities  Age;Fitness;Comorbidity 1    Comorbidities  obesity    Stability/Clinical Decision Making  Stable/Uncomplicated    Rehab Potential  Excellent    PT Frequency  2x / week    PT Duration  6 weeks    PT Treatment/Interventions  Cryotherapy;Electrical Stimulation;Moist Heat;Traction;Ultrasound;Therapeutic exercise;Therapeutic activities;Neuromuscular re-education;Patient/family education;Dry needling;Passive range of motion;Manual techniques;Spinal Manipulations    PT Next Visit Plan  continue with dry needling one time per week, prone cervical stabilization exercise, soft tissue work; possible Discharge    PT Home Exercise Plan  Access Code: [email or web address]    Consulted and Agree with Plan of Care  Patient       Patient will benefit from skilled therapeutic intervention in order to improve the following deficits and  impairments:  Decreased range of motion, Increased fascial restricitons, Decreased activity tolerance,  Pain, Decreased mobility  Visit Diagnosis: Cervicalgia  Other muscle spasm     Problem List Patient Active Problem List   Diagnosis Date Noted  . Angioedema of lips 07/12/2017  . Hypertension 07/12/2017  . Hyponatremia 07/12/2017  . Psoriasis   . Osteoarthritis of left hip 02/13/2017  . 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 Primary osteoarthritis of right hip 03/10/2014  . Status post total replacement of right hip 03/10/2014  . Diastasis recti 02/21/2013    Eulis Foster, PT 03/16/19 2:42 PM   [facility] [address] [place], [place], [address] Phone: [phone]   Fax:  [phone]  Name: Donald [name] MRN: [id number] Date of Birth: 07-Jul-1955

## 2019-03-21 ENCOUNTER — Other Ambulatory Visit: Payer: Self-pay

## 2019-03-21 ENCOUNTER — Ambulatory Visit: Payer: 59 | Admitting: Physical Therapy

## 2019-03-21 ENCOUNTER — Encounter: Payer: Self-pay | Admitting: Physical Therapy

## 2019-03-21 DIAGNOSIS — M542 Cervicalgia: Secondary | ICD-10-CM

## 2019-03-21 DIAGNOSIS — M62838 Other muscle spasm: Secondary | ICD-10-CM

## 2019-03-21 NOTE — Therapy (Signed)
[facility] [address] [place], [place], [address] Phone: [phone]   Fax:  [phone]  Physical Therapy Treatment  Patient Details  Name: Donald [name] MRN: [id number] Date of Birth: 05-Feb-1956 Referring Provider (PT): Dr. London Pepper   Encounter Date: 03/21/2019  PT End of Session - 03/21/19 1406    Visit Number  8    Date for PT Re-Evaluation  04/05/19    Authorization Type  UHC    PT Start Time  1401    PT Stop Time  1440    PT Time Calculation (min)  39 min    Activity Tolerance  Patient tolerated treatment well;No increased pain    Behavior During Therapy  WFL for tasks assessed/performed       Past Medical History:  Diagnosis Date  . Arthritis   . Asthma   . Hyperlipidemia   . Hypertension   . Sleep apnea    cpap settings ? 10     Past Surgical History:  Procedure Laterality Date  . HERNIA REPAIR     x 2   . SINUS EXPLORATION    . TOTAL HIP ARTHROPLASTY Right 03/10/2014   Procedure: RIGHT TOTAL HIP ARTHROPLASTY ANTERIOR APPROACH;  Surgeon: Mcarthur Rossetti, MD;  Location: [facility];  Service: Orthopedics;  Laterality: Right;  . TOTAL HIP ARTHROPLASTY Left 02/13/2017   Procedure: LEFT TOTAL HIP ARTHROPLASTY ANTERIOR APPROACH;  Surgeon: Mcarthur Rossetti, MD;  Location: [facility];  Service: Orthopedics;  Laterality: Left;  . URETEROSCOPY     multiple times     There were no vitals filed for this visit.  Subjective Assessment - 03/21/19 1403    Subjective  It is just an occasional catch.  Last night it didn't bother me as much as the night before.  After previous treatment I was pretty much pain free the next day.    Patient Stated Goals  reduce neck pain    Currently in Pain?  No/denies                       [facility] Adult PT Treatment/Exercise - 03/21/19 0001      Neck Exercises: Machines for Strengthening   UBE (Upper Arm Bike)  2 minutes forward and 2 minutes  backward at 5 RPM   seat #10     Neck Exercises: Standing   Wall Push Ups  15 reps    Wall Push Ups Limitations  red ball on wall    Upper Extremity D2  Flexion;Extension;15 reps;Weights    UE D2 Weights (lbs)  35# at power tower    Other Standing Exercises  bent over row, horizontal abduction - 4# - 10x each side      Neck Exercises: Prone   UE Flexion with Stabilization Limitations  on elbows with chin retraction    Plank  prone on elbows and lift arm with turning head 10 times each side   thoracic rotation   Other Prone Exercise  prone on elbows with chin and head retraction working on the cervical extensors 10x hold 5 seconds    Other Prone Exercise  prone on elbows with chin retraction moving arms forward and back with tactile cues from therapist      Shoulder Exercises: Seated   Other Seated Exercises  4# bicep curl with overhead press - 15x      Manual Therapy   Soft tissue mobilization  deep soft tissue work to  subocciptials               PT Short Term Goals - 03/10/19 1106      PT SHORT TERM GOAL #3   Title  understand ways to sleep with decreased cervical pain    Time  3    Period  Weeks    Status  Achieved    Target Date  03/15/19        PT Long Term Goals - 03/14/19 1226      PT LONG TERM GOAL #1   Title  independent with HEP and understand how to progress himself    Time  6    Period  Weeks    Status  On-going      PT LONG TERM GOAL #2   Title  cervical pain is minimal to no pain when turning his head to the left due to full ROM    Time  6    Period  Weeks    Status  On-going      PT LONG TERM GOAL #3   Title  able to sleep throughout the night without cervical pain waking him up    Baseline  still working on pillow    Time  6    Period  Weeks    Status  On-going            Plan - 03/21/19 1444    Clinical Impression Statement  Pt did well with exercises today.  He was monitored for posture and pain throughout session.  Pt needed  minimal cues throughout session and he had a twinge of pain doing bent over rows, but went away quickly. Pt will benefit from skilled PT for improved postural strength to tolerate maximum functional activities.    PT Treatment/Interventions  Cryotherapy;Electrical Stimulation;Moist Heat;Traction;Ultrasound;Therapeutic exercise;Therapeutic activities;Neuromuscular re-education;Patient/family education;Dry needling;Passive range of motion;Manual techniques;Spinal Manipulations    PT Next Visit Plan  continue with dry needling one time per week, prone cervical stabilization exercise, soft tissue work; possible Discharge    PT Home Exercise Plan  Access Code: [email or web address]    Consulted and Agree with Plan of Care  Patient       Patient will benefit from skilled therapeutic intervention in order to improve the following deficits and impairments:  Decreased range of motion, Increased fascial restricitons, Decreased activity tolerance, Pain, Decreased mobility  Visit Diagnosis: Cervicalgia  Other muscle spasm     Problem List Patient Active Problem List   Diagnosis Date Noted  . Angioedema of lips 07/12/2017  . Hypertension 07/12/2017  . Hyponatremia 07/12/2017  . Psoriasis   . Osteoarthritis of left hip 02/13/2017  . 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 Primary osteoarthritis of right hip 03/10/2014  . Status post total replacement of right hip 03/10/2014  . Diastasis recti 02/21/2013    Junious Silk, PT 03/21/2019, 2:47 PM  [facility] [address] [place], [place], [address] Phone: [phone]   Fax:  [phone]  Name: Donald [name] MRN: [id number] Date of Birth: [birth year]

## 2019-03-23 ENCOUNTER — Other Ambulatory Visit: Payer: Self-pay

## 2019-03-23 ENCOUNTER — Ambulatory Visit: Payer: 59 | Admitting: Physical Therapy

## 2019-03-23 ENCOUNTER — Encounter: Payer: Self-pay | Admitting: Physical Therapy

## 2019-03-23 DIAGNOSIS — M542 Cervicalgia: Secondary | ICD-10-CM | POA: Diagnosis not present

## 2019-03-23 DIAGNOSIS — M62838 Other muscle spasm: Secondary | ICD-10-CM

## 2019-03-23 NOTE — Therapy (Signed)
[facility] [address], [address] [place], [place], [address] Phone: [phone]   Fax:  [phone]  Physical Therapy Treatment  Patient Details  Name: Donald [name] MRN: [id number] Date of Birth: 08-Jan-1956 Referring Provider (PT): Dr. Farris Has   Encounter Date: 03/23/2019  PT End of Session - 03/23/19 1344    Visit Number  9    Date for PT Re-Evaluation  04/05/19    Authorization Type  [employer]    PT Start Time  1230    PT Stop Time  1310    PT Time Calculation (min)  40 min    Activity Tolerance  Patient tolerated treatment well;No increased pain    Behavior During Therapy  WFL for tasks assessed/performed       Past Medical History:  Diagnosis Date  . Arthritis   . Asthma   . Hyperlipidemia   . Hypertension   . Sleep apnea    cpap settings ? 10     Past Surgical History:  Procedure Laterality Date  . HERNIA REPAIR     x 2   . SINUS EXPLORATION    . TOTAL HIP ARTHROPLASTY Right 03/10/2014   Procedure: RIGHT TOTAL HIP ARTHROPLASTY ANTERIOR APPROACH;  Surgeon: Kathryne Hitch, MD;  Location: [facility];  Service: Orthopedics;  Laterality: Right;  . TOTAL HIP ARTHROPLASTY Left 02/13/2017   Procedure: LEFT TOTAL HIP ARTHROPLASTY ANTERIOR APPROACH;  Surgeon: Kathryne Hitch, MD;  Location: [facility];  Service: Orthopedics;  Laterality: Left;  . URETEROSCOPY     multiple times     There were no vitals filed for this visit.  Subjective Assessment - 03/23/19 1232    Subjective  I am able to sleep through the night. I only have pain at endrange of the movement.    Patient Stated Goals  reduce neck pain    Currently in Pain?  No/denies                       [facility] Adult PT Treatment/Exercise - 03/23/19 0001      Neck Exercises: Machines for Strengthening   UBE (Upper Arm Bike)  2 minutes forward and 2 minutes backward at 5 RPM   seat #10     Neck Exercises: Standing   Wall Push Ups   15 reps    Wall Push Ups Limitations  red ball on wall    Upper Extremity D2  Flexion;Extension;15 reps;Weights    UE D2 Weights (lbs)  35# at power tower    Other Standing Exercises  bent over row, horizontal abduction - 4# - 10x each side      Neck Exercises: Supine   Neck Retraction  5 reps;5 secs    Neck Retraction Limitations  with head off the mat    Other Supine Exercise  melt with the foam roll 10x each way      Neck Exercises: Prone   Upper Extremity Flexion with Stabilization  10 reps;Flexion   prone on elbows with 1# wt. 10 each; alt.    UE Flexion with Stabilization Limitations  on elbows with chin retraction    Plank  prone on elbows and lift arm with turning head 10 times each side   thoracic rotation; with 1#; 10x each     Shoulder Exercises: Seated   Flexion  Strengthening;Both;10 reps;Weights    Flexion Weight (lbs)  4    Flexion Limitations  to 90 degrees  Abduction  Strengthening;Both;10 reps;Weights    ABduction Weight (lbs)  4#    ABduction Limitations  just overhead      Manual Therapy   Manual Therapy  Soft tissue mobilization    Manual therapy comments  chin retraction 5 times; chin retraction with extension 5x, chin retraction with extenion then wiggle 5 x    Soft tissue mobilization  deep soft tissue work to subocciptials               PT Short Term Goals - 03/10/19 1106      PT SHORT TERM GOAL #3   Title  understand ways to sleep with decreased cervical pain    Time  3    Period  Weeks    Status  Achieved    Target Date  03/15/19        PT Long Term Goals - 03/23/19 1346      PT LONG TERM GOAL #3   Title  able to sleep throughout the night without cervical pain waking him up    Time  6    Period  Weeks    Status  Achieved            Plan - 03/23/19 1344    Clinical Impression Statement  Patient able to sleep without pain. Patient has pain at endrange of rotation. After therapy, patient had full cervical rotation.  Patient is doing more overhead weights without difficulty. Patient will benefit from skilled therapy to improve postural strength to tolerate maximum functional activities.    Personal Factors and Comorbidities  Age;Fitness;Comorbidity 1    Comorbidities  obesity    Examination-Participation Restrictions  Driving    Stability/Clinical Decision Making  Stable/Uncomplicated    Rehab Potential  Excellent    PT Frequency  2x / week    PT Duration  6 weeks    PT Treatment/Interventions  Cryotherapy;Electrical Stimulation;Moist Heat;Traction;Ultrasound;Therapeutic exercise;Therapeutic activities;Neuromuscular re-education;Patient/family education;Dry needling;Passive range of motion;Manual techniques;Spinal Manipulations    PT Next Visit Plan  continue with dry needling one time per week, prone cervical stabilization exercise, soft tissue work; possible Discharge    PT Home Exercise Plan  Access Code: [email or web address]    Consulted and Agree with Plan of Care  Patient       Patient will benefit from skilled therapeutic intervention in order to improve the following deficits and impairments:  Decreased range of motion, Increased fascial restricitons, Decreased activity tolerance, Pain, Decreased mobility  Visit Diagnosis: Cervicalgia  Other muscle spasm     Problem List Patient Active Problem List   Diagnosis Date Noted  . Angioedema of lips 07/12/2017  . Hypertension 07/12/2017  . Hyponatremia 07/12/2017  . Psoriasis   . Osteoarthritis of left hip 02/13/2017  . 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 Primary osteoarthritis of right hip 03/10/2014  . Status post total replacement of right hip 03/10/2014  . Diastasis recti 02/21/2013    Earlie Counts, PT 03/23/19 1:47 PM    [facility] [address], [place], [place], [address] Phone: [phone]   Fax:  [phone]  Name: Donald [name] MRN: [id number] Date  of Birth: July 16, 1955

## 2019-03-28 ENCOUNTER — Other Ambulatory Visit: Payer: Self-pay

## 2019-03-28 ENCOUNTER — Ambulatory Visit: Payer: 59 | Admitting: Physical Therapy

## 2019-03-28 ENCOUNTER — Encounter: Payer: Self-pay | Admitting: Physical Therapy

## 2019-03-28 DIAGNOSIS — M62838 Other muscle spasm: Secondary | ICD-10-CM

## 2019-03-28 DIAGNOSIS — M542 Cervicalgia: Secondary | ICD-10-CM | POA: Diagnosis not present

## 2019-03-28 NOTE — Therapy (Signed)
[facility] [address], [address] [place], [place], [address] Phone: [phone]   Fax:  [phone]  Physical Therapy Treatment  Patient Details  Name: Donald [name] MRN: [id number] Date of Birth: [birth year] Referring Provider (PT): Dr. Farris HasAaron Morrow   Encounter Date: 03/28/2019  PT End of Session - 03/28/19 1411    Visit Number  10    Date for PT Re-Evaluation  04/05/19    Authorization Type  [employer]HC    PT Start Time  1400    PT Stop Time  1440    PT Time Calculation (min)  40 min    Activity Tolerance  Patient tolerated treatment well;No increased pain    Behavior During Therapy  WFL for tasks assessed/performed       Past Medical History:  Diagnosis Date  . Arthritis   . Asthma   . Hyperlipidemia   . Hypertension   . Sleep apnea    cpap settings ? 10     Past Surgical History:  Procedure Laterality Date  . HERNIA REPAIR     x 2   . SINUS EXPLORATION    . TOTAL HIP ARTHROPLASTY Right 03/10/2014   Procedure: RIGHT TOTAL HIP ARTHROPLASTY ANTERIOR APPROACH;  Surgeon: Kathryne Hitchhristopher Y Blackman, MD;  Location: [facility];  Service: Orthopedics;  Laterality: Right;  . TOTAL HIP ARTHROPLASTY Left 02/13/2017   Procedure: LEFT TOTAL HIP ARTHROPLASTY ANTERIOR APPROACH;  Surgeon: Kathryne HitchBlackman, Christopher Y, MD;  Location: [facility];  Service: Orthopedics;  Laterality: Left;  . URETEROSCOPY     multiple times     There were no vitals filed for this visit.  Subjective Assessment - 03/28/19 1405    Subjective  I was doing well until last night. When I sleep on my right side I get pain. Pain durng the day is much better. When I look up and turn head to the left increases pain.    Patient Stated Goals  reduce neck pain    Currently in Pain?  Yes    Pain Score  6     Pain Location  Neck    Pain Orientation  Right    Pain Descriptors / Indicators  Sharp    Pain Type  Acute pain    Pain Onset  More than a month ago    Pain Frequency   Intermittent    Aggravating Factors   laying on right, layin on right side and sidebend neck to left    Pain Relieving Factors  heat    Multiple Pain Sites  No                       [facility] Adult PT Treatment/Exercise - 03/28/19 0001      Neck Exercises: Machines for Strengthening   UBE (Upper Arm Bike)  2 minutes forward and 2 minutes backward at 5 RPM   seat #10     Neck Exercises: Supine   Other Supine Exercise  melt with the foam roll 10x each way      Neck Exercises: Prone   Plank  prone on elbows and lift arm with turning head 10 times each side   thoracic rotation; with 1#; 10x each     Manual Therapy   Manual Therapy  Soft tissue mobilization;Joint mobilization    Joint Mobilization  sitting upslide glide and downslide glide to C2 and C3 on the right, P-A mobilization to C3-C5 grade III, sideglide to C3-C7 bilateral grade  III    Soft tissue mobilization  deep soft tissue work to subocciptials, cervical paraspinals and thoracic paraspinals, around the spinous processes       Trigger Point Dry Needling - 03/28/19 0001    Consent Given?  Yes    Education Handout Provided  Previously provided    Muscles Treated Head and Neck  Suboccipitals;Splenius capitus;Semispinalis capitus;Oblique capitus    Other Dry Needling  bil. sides of T1-T3    Oblique Capitus Response  Twitch response elicited;Palpable increased muscle length    Splenius capitus Response  Twitch reponse elicited;Palpable increased muscle length    Semispinalis capitus Response  Twitch reponse elicited;Palpable increased muscle length             PT Short Term Goals - 03/10/19 1106      PT SHORT TERM GOAL #3   Title  understand ways to sleep with decreased cervical pain    Time  3    Period  Weeks    Status  Achieved    Target Date  03/15/19        PT Long Term Goals - 03/28/19 1443      PT LONG TERM GOAL #1   Title  independent with HEP and understand how to progress himself     Time  6    Period  Weeks    Status  On-going      PT LONG TERM GOAL #2   Title  cervical pain is minimal to no pain when turning his head to the left due to full ROM    Time  6    Period  Weeks    Status  On-going      PT LONG TERM GOAL #3   Title  able to sleep throughout the night without cervical pain waking him up    Baseline  still working on pillow    Time  6    Period  Weeks    Status  Achieved            Plan - 03/28/19 1437    Clinical Impression Statement  Patient has improved cervical vertebral movement. Patient has pain with left cervial rotation with extension on the right subocciptial area. Patient had a flare-up with sleeping on the right side. Patietn will benefit from skilled therapy to improve postureal strength to tolerate maximu funcitonal activities.    Personal Factors and Comorbidities  Age;Fitness;Comorbidity 1    Comorbidities  obesity    Examination-Participation Restrictions  Driving    Stability/Clinical Decision Making  Stable/Uncomplicated    Rehab Potential  Excellent    PT Frequency  2x / week    PT Duration  6 weeks    PT Treatment/Interventions  Cryotherapy;Electrical Stimulation;Moist Heat;Traction;Ultrasound;Therapeutic exercise;Therapeutic activities;Neuromuscular re-education;Patient/family education;Dry needling;Passive range of motion;Manual techniques;Spinal Manipulations    PT Next Visit Plan  right C2-C3 mobilization; cervical strengthening    PT Home Exercise Plan  Access Code: [email or web address]    Consulted and Agree with Plan of Care  Patient       Patient will benefit from skilled therapeutic intervention in order to improve the following deficits and impairments:  Decreased range of motion, Increased fascial restricitons, Decreased activity tolerance, Pain, Decreased mobility  Visit Diagnosis: Cervicalgia  Other muscle spasm     Problem List Patient Active Problem List   Diagnosis Date Noted  . Angioedema of lips 07/12/2017   . Hypertension 07/12/2017  . Hyponatremia 07/12/2017  . Psoriasis   . Osteoarthritis of left  hip 02/13/2017  . 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 Primary osteoarthritis of right hip 03/10/2014  . Status post total replacement of right hip 03/10/2014  . Diastasis recti 02/21/2013    Eulis Foster, PT 03/28/19 2:44 PM   [facility] [address] [place], [place], [address] Phone: [phone]   Fax:  [phone]  Name: Donald [name] MRN: [id number] Date of Birth: [birth year]

## 2019-03-30 ENCOUNTER — Other Ambulatory Visit: Payer: Self-pay

## 2019-03-30 ENCOUNTER — Ambulatory Visit: Payer: 59 | Attending: Family Medicine | Admitting: Physical Therapy

## 2019-03-30 ENCOUNTER — Encounter: Payer: Self-pay | Admitting: Physical Therapy

## 2019-03-30 DIAGNOSIS — M542 Cervicalgia: Secondary | ICD-10-CM | POA: Insufficient documentation

## 2019-03-30 DIAGNOSIS — M62838 Other muscle spasm: Secondary | ICD-10-CM | POA: Diagnosis present

## 2019-03-30 NOTE — Therapy (Signed)
[facility] [address] [place], [place], [address] Phone: [phone]   Fax:  [phone]  Physical Therapy Treatment  Patient Details  Name: Donald [name] MRN: [id number] Date of Birth: [birth year] Referring Provider (PT): Dr. London Pepper   Encounter Date: 03/30/2019  PT End of Session - 03/30/19 1408    Visit Number  11    Date for PT Re-Evaluation  04/05/19    Authorization Type  UHC    PT Start Time  1400    PT Stop Time  1440    PT Time Calculation (min)  40 min    Activity Tolerance  Patient tolerated treatment well;No increased pain    Behavior During Therapy  WFL for tasks assessed/performed       Past Medical History:  Diagnosis Date  . Arthritis   . Asthma   . Hyperlipidemia   . Hypertension   . Sleep apnea    cpap settings ? 10     Past Surgical History:  Procedure Laterality Date  . HERNIA REPAIR     x 2   . SINUS EXPLORATION    . TOTAL HIP ARTHROPLASTY Right 03/10/2014   Procedure: RIGHT TOTAL HIP ARTHROPLASTY ANTERIOR APPROACH;  Surgeon: Mcarthur Rossetti, MD;  Location: [facility];  Service: Orthopedics;  Laterality: Right;  . TOTAL HIP ARTHROPLASTY Left 02/13/2017   Procedure: LEFT TOTAL HIP ARTHROPLASTY ANTERIOR APPROACH;  Surgeon: Mcarthur Rossetti, MD;  Location: [facility];  Service: Orthopedics;  Laterality: Left;  . URETEROSCOPY     multiple times     There were no vitals filed for this visit.  Subjective Assessment - 03/30/19 1404    Subjective  Today is good. I struggle with the pain at night when I sleep. I felt better after the last session.    Patient Stated Goals  reduce neck pain    Currently in Pain?  Yes    Pain Score  6     Pain Location  Neck    Pain Orientation  Right    Pain Descriptors / Indicators  Sharp    Pain Type  Acute pain    Pain Radiating Towards  goes up into the right side of skull    Pain Onset  More than a month ago    Pain Frequency   Intermittent    Aggravating Factors   laying on right side at night    Pain Relieving Factors  heat    Multiple Pain Sites  No                       [facility] Adult PT Treatment/Exercise - 03/30/19 0001      Therapeutic Activites    Therapeutic Activities  Other Therapeutic Activities    Other Therapeutic Activities  sleeping on side with towel roll along the neck to support      Neck Exercises: Machines for Strengthening   UBE (Upper Arm Bike)  3 minutes forward and 3 minutes backward at 5 RPM; while assessing patient   seat #10     Neck Exercises: Supine   Other Supine Exercise  isometrics with cervical extension suboccitipal and Cervical extension with right rotation      Neck Exercises: Sidelying   Lateral Flexion  Right;Left;10 reps    Lateral Flexion Limitations  therapist preventing rotation    Other Sidelying Exercise  sidebend neck then rotate each side 10x with burning going to the left  Other Sidelying Exercise  after each mobilization to cervical he would lay on right and left SB with left rotation to test the motion and see where restrictions are      Shoulder Exercises: Seated   Other Seated Exercises  seated mulligan technique using a towel for cervical rotation 7 times      Manual Therapy   Manual Therapy  Joint mobilization    Joint Mobilization  mulligan upslide to right CR with cervical rotation left and SB left, left side down glide on left cervical facets with left SB, Side glide to C1 to improve left rotation             PT Education - 03/30/19 1438    Education Details  before he goes to bed he is to use the [employer] for left rotation and use a towel roll on the pillow    Person(s) Educated  Patient    Methods  Explanation;Demonstration    Comprehension  Verbalized understanding;Returned demonstration       PT Short Term Goals - 03/10/19 1106      PT SHORT TERM GOAL #3   Title  understand ways to sleep with decreased  cervical pain    Time  3    Period  Weeks    Status  Achieved    Target Date  03/15/19        PT Long Term Goals - 03/28/19 1443      PT LONG TERM GOAL #1   Title  independent with HEP and understand how to progress himself    Time  6    Period  Weeks    Status  On-going      PT LONG TERM GOAL #2   Title  cervical pain is minimal to no pain when turning his head to the left due to full ROM    Time  6    Period  Weeks    Status  On-going      PT LONG TERM GOAL #3   Title  able to sleep throughout the night without cervical pain waking him up    Baseline  still working on pillow    Time  6    Period  Weeks    Status  Achieved            Plan - 03/30/19 1409    Clinical Impression Statement  Patient has pain when he turns from right sidely to left sidely when in bed. Patient has restrictions on the right C1-C3 facets and proper movement of C1. Patient had full range and slight pain at the end of manual work. Patient knows what to do prior to bed to improve the restrictions. Patient knows to use a towel roll in under the neck area to keep spinal neutral. Patient will benefit from skilled therapy to improve postural strength to tolerate functional activities.    Personal Factors and Comorbidities  Age;Fitness;Comorbidity 1    Comorbidities  obesity    Examination-Participation Restrictions  Driving    Stability/Clinical Decision Making  Stable/Uncomplicated    Rehab Potential  Excellent    PT Frequency  2x / week    PT Duration  6 weeks    PT Treatment/Interventions  Cryotherapy;Electrical Stimulation;Moist Heat;Traction;Ultrasound;Therapeutic exercise;Therapeutic activities;Neuromuscular re-education;Patient/family education;Dry needling;Passive range of motion;Manual techniques;Spinal Manipulations    PT Next Visit Plan  see how sleeping is doing, check left rotation with sidebending    PT Home Exercise Plan  Access Code: [email or web address]  Consulted and Agree with Plan of Care   Patient       Patient will benefit from skilled therapeutic intervention in order to improve the following deficits and impairments:  Decreased range of motion, Increased fascial restricitons, Decreased activity tolerance, Pain, Decreased mobility  Visit Diagnosis: Cervicalgia  Other muscle spasm     Problem List Patient Active Problem List   Diagnosis Date Noted  . Angioedema of lips 07/12/2017  . Hypertension 07/12/2017  . Hyponatremia 07/12/2017  . Psoriasis   . Osteoarthritis of left hip 02/13/2017  . 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 Primary osteoarthritis of right hip 03/10/2014  . Status post total replacement of right hip 03/10/2014  . Diastasis recti 02/21/2013    Eulis Fosterheryl Gray, PT 03/30/19 2:43 PM   [facility] [address], [address] [place], [place], [address] Phone: [phone]   Fax:  [phone]  Name: Donald [name] MRN: [id number] Date of Birth: 16-May-1955

## 2019-04-04 ENCOUNTER — Ambulatory Visit: Payer: 59 | Admitting: Physical Therapy

## 2019-04-04 ENCOUNTER — Encounter: Payer: Self-pay | Admitting: Physical Therapy

## 2019-04-04 ENCOUNTER — Other Ambulatory Visit: Payer: Self-pay

## 2019-04-04 DIAGNOSIS — M542 Cervicalgia: Secondary | ICD-10-CM

## 2019-04-04 DIAGNOSIS — M62838 Other muscle spasm: Secondary | ICD-10-CM

## 2019-04-04 NOTE — Therapy (Signed)
[facility] [address], [address] [place], [place], [address] Phone: [phone]   Fax:  [phone]  Physical Therapy Treatment  Patient Details  Name: Donald [name] MRN: [id number] Date of Birth: [birth year] Referring Provider (PT): Dr. Farris HasAaron Morrow   Encounter Date: 04/04/2019  PT End of Session - 04/04/19 1437    Visit Number  12    Date for PT Re-Evaluation  05/02/19    PT Start Time  1400    PT Stop Time  1438    PT Time Calculation (min)  38 min    Activity Tolerance  Patient tolerated treatment well;No increased pain    Behavior During Therapy  WFL for tasks assessed/performed       Past Medical History:  Diagnosis Date  . Arthritis   . Asthma   . Hyperlipidemia   . Hypertension   . Sleep apnea    cpap settings ? 10     Past Surgical History:  Procedure Laterality Date  . HERNIA REPAIR     x 2   . SINUS EXPLORATION    . TOTAL HIP ARTHROPLASTY Right 03/10/2014   Procedure: RIGHT TOTAL HIP ARTHROPLASTY ANTERIOR APPROACH;  Surgeon: Kathryne Hitchhristopher Y Blackman, MD;  Location: [facility];  Service: Orthopedics;  Laterality: Right;  . TOTAL HIP ARTHROPLASTY Left 02/13/2017   Procedure: LEFT TOTAL HIP ARTHROPLASTY ANTERIOR APPROACH;  Surgeon: Kathryne HitchBlackman, Christopher Y, MD;  Location: [facility];  Service: Orthopedics;  Laterality: Left;  . URETEROSCOPY     multiple times     There were no vitals filed for this visit.  Subjective Assessment - 04/04/19 1405    Subjective  I slept the last 2 nights. The towel exercise helped.    Patient Stated Goals  reduce neck pain    Currently in Pain?  Yes    Pain Score  2     Pain Location  Neck    Pain Orientation  Right    Pain Descriptors / Indicators  Sharp    Pain Type  Acute pain    Pain Onset  More than a month ago    Pain Frequency  Intermittent    Aggravating Factors   laying on right side at night    Pain Relieving Factors  heat    Multiple Pain Sites  No          [facility] PT Assessment - 04/04/19 0001      Assessment   Medical Diagnosis  M54.2 Neck pain on right side    Referring Provider (PT)  Dr. Farris HasAaron Morrow    Onset Date/Surgical Date  11/27/18    Prior Therapy  None      Precautions   Precautions  None      Restrictions   Weight Bearing Restrictions  No      Prior Function   Level of Independence  Independent    Vocation Requirements  desk work      Cognition   Overall Cognitive Status  Within Functional Limits for tasks assessed      Posture/Postural Control   Posture/Postural Control  Postural limitations    Postural Limitations  Rounded Shoulders;Forward head      ROM / Strength   AROM / PROM / Strength  AROM;PROM;Strength      AROM   Cervical Flexion  55    Cervical Extension  55    Cervical - Right Side Bend  40    Cervical - Left Side Bend  35    Cervical - Right Rotation  85   end of treatment was 90 degrees   Cervical - Left Rotation  75   end of treatment was 90 degrees                  [facility] Adult PT Treatment/Exercise - 04/04/19 0001      Neck Exercises: Machines for Strengthening   UBE (Upper Arm Bike)  3 minutes forward and 3 minutes backward at 5 RPM; while assessing patient   seat #10     Neck Exercises: Supine   Cervical Rotation  Right;Left;10 reps   head on red physioball   Cervical Rotation Limitations  opposite hand under back for increased stretch    Other Supine Exercise  melt with 2 tennis ball at the neck head connectionl 10x each way    Other Supine Exercise  supine cervical rotation contract relax to increase right rotation      Neck Exercises: Sidelying   Lateral Flexion  Right;Left;10 reps    Lateral Flexion Limitations  therapist preventing rotation    Other Sidelying Exercise  sidebend neck then rotate each side 10x with burning going to the left    Other Sidelying Exercise  after each mobilization to cervical he would lay on right and left SB with left rotation to  test the motion and see where restrictions are      Shoulder Exercises: Seated   Other Seated Exercises  seated cervical and thoracic rotation 5 times each way      Manual Therapy   Manual Therapy  Joint mobilization    Joint Mobilization  upslide and down slide of C2 and Cs               PT Short Term Goals - 03/10/19 1106      PT SHORT TERM GOAL #3   Title  understand ways to sleep with decreased cervical pain    Time  3    Period  Weeks    Status  Achieved    Target Date  03/15/19        PT Long Term Goals - 04/04/19 1408      PT LONG TERM GOAL #1   Title  independent with HEP and understand how to progress himself    Time  6    Period  Weeks    Status  On-going      PT LONG TERM GOAL #2   Title  cervical pain is minimal to no pain when turning his head to the left due to full ROM    Baseline  2/10 pain    Time  6    Period  Weeks    Status  On-going      PT LONG TERM GOAL #3   Title  able to sleep throughout the night without cervical pain waking him up    Baseline  able to sleep the last 2 nights without waking up    Time  6    Period  Weeks    Status  On-going      PT LONG TERM GOAL #4   Title  FOTO score </= 35% limitation    Time  6    Period  Weeks    Status  New            Plan - 04/04/19 1438    Clinical Impression Statement  Patient was able to sleep for 2 nights without waking up. Patient  still has pain at endrange of right cervical rotation and cervical rotation wtih extension. Patient has tightness in C1-C3 vertebrae.Patient has learned ways to use a towel roll to adjust his pillow to help him sleep. Patient will use the towel mobilization prior to bed and does better with sleeping. At end of treatment  with manual mobilization, the cervical rotation was full and increased. Patient can turn to look behind himself with greater ease. Patient will benefit from skilled therapy to improve cervical ROM and reduce pain with sleeping.     Personal Factors and Comorbidities  Age;Fitness;Comorbidity 1    Comorbidities  obesity    Examination-Participation Restrictions  Driving    Stability/Clinical Decision Making  Stable/Uncomplicated    Rehab Potential  Excellent    PT Frequency  2x / week    PT Duration  6 weeks    PT Treatment/Interventions  Cryotherapy;Electrical Stimulation;Moist Heat;Traction;Ultrasound;Therapeutic exercise;Therapeutic activities;Neuromuscular re-education;Patient/family education;Dry needling;Passive range of motion;Manual techniques;Spinal Manipulations    PT Next Visit Plan  see how sleeping is doing, check left rotation with sidebending, continue to work on C1-C3 facet    PT Home Exercise Plan  Access Code: [email or web address]    Consulted and Agree with Plan of Care  Patient       Patient will benefit from skilled therapeutic intervention in order to improve the following deficits and impairments:  Decreased range of motion, Increased fascial restricitons, Decreased activity tolerance, Pain, Decreased mobility  Visit Diagnosis: Cervicalgia  Other muscle spasm     Problem List Patient Active Problem List   Diagnosis Date Noted  . Angioedema of lips 07/12/2017  . Hypertension 07/12/2017  . Hyponatremia 07/12/2017  . Psoriasis   . Osteoarthritis of left hip 02/13/2017  . 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 Primary osteoarthritis of right hip 03/10/2014  . Status post total replacement of right hip 03/10/2014  . Diastasis recti 02/21/2013    Eulis Foster, PT 04/04/19 2:44 PM   [facility] [address], [address] [place], [place], [address] Phone: [phone]   Fax:  [phone]  Name: Donald [name] MRN: [id number] Date of Birth: 18-Jan-1956

## 2019-04-06 ENCOUNTER — Ambulatory Visit: Payer: 59

## 2019-04-06 ENCOUNTER — Other Ambulatory Visit: Payer: Self-pay

## 2019-04-06 DIAGNOSIS — M542 Cervicalgia: Secondary | ICD-10-CM

## 2019-04-06 DIAGNOSIS — M62838 Other muscle spasm: Secondary | ICD-10-CM

## 2019-04-06 NOTE — Therapy (Signed)
[facility] [address], [address] [place], [place], [address] Phone: [phone]   Fax:  [phone]  Physical Therapy Treatment  Patient Details  Name: Donald [name] MRN: [id number] Date of Birth: January 24, 1956 Referring Provider (PT): Dr. Farris HasAaron Morrow   Encounter Date: 04/06/2019  PT End of Session - 04/06/19 1316    Visit Number  13    Date for PT Re-Evaluation  05/02/19    Authorization Type  [employer]    PT Start Time  1231    PT Stop Time  1315    PT Time Calculation (min)  44 min    Activity Tolerance  Patient tolerated treatment well;No increased pain    Behavior During Therapy  WFL for tasks assessed/performed       Past Medical History:  Diagnosis Date  . Arthritis   . Asthma   . Hyperlipidemia   . Hypertension   . Sleep apnea    cpap settings ? 10     Past Surgical History:  Procedure Laterality Date  . HERNIA REPAIR     x 2   . SINUS EXPLORATION    . TOTAL HIP ARTHROPLASTY Right 03/10/2014   Procedure: RIGHT TOTAL HIP ARTHROPLASTY ANTERIOR APPROACH;  Surgeon: Kathryne Hitchhristopher Y Blackman, MD;  Location: [facility];  Service: Orthopedics;  Laterality: Right;  . TOTAL HIP ARTHROPLASTY Left 02/13/2017   Procedure: LEFT TOTAL HIP ARTHROPLASTY ANTERIOR APPROACH;  Surgeon: Kathryne HitchBlackman, Christopher Y, MD;  Location: [facility];  Service: Orthopedics;  Laterality: Left;  . URETEROSCOPY     multiple times     There were no vitals filed for this visit.  Subjective Assessment - 04/06/19 1235    Subjective  I had pain last night up to 3/10.  Prior to that,  I had 5 nights with minimal to no pain.    Currently in Pain?  Yes    Pain Score  1     Pain Location  Neck    Pain Orientation  Right    Pain Descriptors / Indicators  Sharp    Pain Type  Acute pain                       [facility] Adult PT Treatment/Exercise - 04/06/19 0001      Neck Exercises: Machines for Strengthening   UBE (Upper Arm Bike)  Level 2 x 6  minutes (3/3) while assessing patient   seat #10     Neck Exercises: Supine   Cervical Rotation  Right;Left;10 reps   head on red physioball   Cervical Rotation Limitations  opposite hand under back for increased stretch    Other Supine Exercise  supine cervical rotation contract relax to increase right rotation      Manual Therapy   Manual Therapy  Joint mobilization    Manual therapy comments  elongation and P/ROM with overpressure into bil rotation and sidebending    Joint Mobilization  upslide and down slide of C2 and Cs       Trigger Point Dry Needling - 04/06/19 0001    Consent Given?  Yes    Other Dry Needling  bil. sides of T1-T3    Oblique Capitus Response  Twitch response elicited;Palpable increased muscle length    Splenius capitus Response  Twitch reponse elicited;Palpable increased muscle length    Semispinalis capitus Response  Twitch reponse elicited;Palpable increased muscle length    Cervical multifidi Response  Twitch reponse elicited;Palpable increased muscle length  PT Short Term Goals - 03/10/19 1106      PT SHORT TERM GOAL #3   Title  understand ways to sleep with decreased cervical pain    Time  3    Period  Weeks    Status  Achieved    Target Date  03/15/19        PT Long Term Goals - 04/04/19 1408      PT LONG TERM GOAL #1   Title  independent with HEP and understand how to progress himself    Time  6    Period  Weeks    Status  On-going      PT LONG TERM GOAL #2   Title  cervical pain is minimal to no pain when turning his head to the left due to full ROM    Baseline  2/10 pain    Time  6    Period  Weeks    Status  On-going      PT LONG TERM GOAL #3   Title  able to sleep throughout the night without cervical pain waking him up    Baseline  able to sleep the last 2 nights without waking up    Time  6    Period  Weeks    Status  On-going      PT LONG TERM GOAL #4   Title  FOTO score </= 35% limitation    Time  6     Period  Weeks    Status  New            Plan - 04/06/19 1315    Clinical Impression Statement  Patient was able to sleep for 5 nights without waking up and had a bad night last night. Patient still has pain at end range of right cervical rotation and cervical rotation wtih extension. Pt compensates with trunk.  Patient has tightness in C1-C3 vertebrae.  Pt had tension in the Lt cervical multifidi>Rt today and demonstrated improved tissue mobility and improved A/ROM to the Rt after manual therapy today.  Pt will continue to benefit from skilled PT for cervical ROM and mobility.    PT Frequency  2x / week    PT Duration  6 weeks    PT Treatment/Interventions  Cryotherapy;Electrical Stimulation;Moist Heat;Traction;Ultrasound;Therapeutic exercise;Therapeutic activities;Neuromuscular re-education;Patient/family education;Dry needling;Passive range of motion;Manual techniques;Spinal Manipulations    PT Next Visit Plan  continue to work on C1-3 mobility, manual therapy, strength and flexibility    PT Home Exercise Plan  Access Code: [email or web address]    Consulted and Agree with Plan of Care  Patient       Patient will benefit from skilled therapeutic intervention in order to improve the following deficits and impairments:  Decreased range of motion, Increased fascial restricitons, Decreased activity tolerance, Pain, Decreased mobility  Visit Diagnosis: Other muscle spasm  Cervicalgia     Problem List Patient Active Problem List   Diagnosis Date Noted  . Angioedema of lips 07/12/2017  . Hypertension 07/12/2017  . Hyponatremia 07/12/2017  . Psoriasis   . Osteoarthritis of left hip 02/13/2017  . 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 Primary osteoarthritis of right hip 03/10/2014  . Status post total replacement of right hip 03/10/2014  . Diastasis recti 02/21/2013   Donald [name], PT 04/06/19 1:18 PM  [facility] [address], [place], [place], [address] Phone: [phone]   Fax:  [phone]  Name: Donald [name] MRN: [id number] Date of Birth: 06-Dec-1955

## 2019-04-11 ENCOUNTER — Other Ambulatory Visit: Payer: Self-pay

## 2019-04-11 ENCOUNTER — Ambulatory Visit: Payer: 59

## 2019-04-11 DIAGNOSIS — M542 Cervicalgia: Secondary | ICD-10-CM

## 2019-04-11 DIAGNOSIS — M62838 Other muscle spasm: Secondary | ICD-10-CM

## 2019-04-11 NOTE — Therapy (Signed)
[facility] [address], [address] [place], [place], [address] Phone: [phone]   Fax:  [phone]  Physical Therapy Treatment  Patient Details  Name: Donald [name] MRN: [id number] Date of Birth: 26-Jun-1955 Referring Provider (PT): Dr. Farris Has   Encounter Date: 04/11/2019  PT End of Session - 04/11/19 1447    Visit Number  14    Date for PT Re-Evaluation  05/02/19    Authorization Type  [employer]    PT Start Time  1403    PT Stop Time  1444    PT Time Calculation (min)  41 min    Activity Tolerance  Patient tolerated treatment well;No increased pain    Behavior During Therapy  WFL for tasks assessed/performed       Past Medical History:  Diagnosis Date  . Arthritis   . Asthma   . Hyperlipidemia   . Hypertension   . Sleep apnea    cpap settings ? 10     Past Surgical History:  Procedure Laterality Date  . HERNIA REPAIR     x 2   . SINUS EXPLORATION    . TOTAL HIP ARTHROPLASTY Right 03/10/2014   Procedure: RIGHT TOTAL HIP ARTHROPLASTY ANTERIOR APPROACH;  Surgeon: Kathryne Hitch, MD;  Location: [facility];  Service: Orthopedics;  Laterality: Right;  . TOTAL HIP ARTHROPLASTY Left 02/13/2017   Procedure: LEFT TOTAL HIP ARTHROPLASTY ANTERIOR APPROACH;  Surgeon: Kathryne Hitch, MD;  Location: [facility];  Service: Orthopedics;  Laterality: Left;  . URETEROSCOPY     multiple times     There were no vitals filed for this visit.  Subjective Assessment - 04/11/19 1406    Subjective  I had to get rid of the towel roll at night. I am trying a new pillow. I have had 2-3 good nights since I was here last.    Currently in Pain?  Yes    Pain Score  3     Pain Location  Neck    Pain Orientation  Right    Pain Descriptors / Indicators  Sharp    Pain Type  Acute pain    Pain Onset  More than a month ago    Pain Frequency  Intermittent    Aggravating Factors   sleep at night, moving head during the day    Pain  Relieving Factors  massaging it, stretching it out                       [facility] Adult PT Treatment/Exercise - 04/11/19 0001      Neck Exercises: Machines for Strengthening   UBE (Upper Arm Bike)  Level 2 x 6 minutes (3/3) while assessing patient   seat #10     Neck Exercises: Sidelying   Other Sidelying Exercise  open book stretch x 10 each      Neck Exercises: Prone   Other Prone Exercise  quadrant stretch bil x10       Manual Therapy   Manual Therapy  Joint mobilization    Joint Mobilization  passive A/ROM into rotation, sidebending and quadrants.  lateral glides C2-4 and PA mobs                PT Short Term Goals - 03/10/19 1106      PT SHORT TERM GOAL #3   Title  understand ways to sleep with decreased cervical pain    Time  3    Period  Weeks    Status  Achieved    Target Date  03/15/19        PT Long Term Goals - 04/11/19 1408      PT LONG TERM GOAL #1   Title  independent with HEP and understand how to progress himself    Time  6    Period  Weeks    Status  On-going      PT LONG TERM GOAL #2   Title  cervical pain is minimal to no pain when turning his head to the left due to full ROM    Baseline  2-3/10    Time  6    Period  Weeks    Status  On-going      PT LONG TERM GOAL #3   Title  able to sleep throughout the night without cervical pain waking him up    Baseline  this is inconsistent    Time  6    Period  Weeks    Status  On-going            Plan - 04/11/19 1415    Clinical Impression Statement  Pt continues to have inconsistent sleep due to neck pain.  Overall, pt reports improvement in sleep with fewer nights of interruption.  Pt is experimenting with pillows and towel rolls for comfort at night.  Pt responded well to dry needling last session and had improved sleep 2 days after needling.  Pt with quadrant stiffness with rotation and extension with prone stretch.  Pt with reduced mobility in the upper cervical  vertebrae and demonstrated improved mobility after manual therapy for tissue and segmental mobilization.  Pt will continue to benefit from skilled PT to address upper cervical mobility to reduce pain and improve sleep at night.    PT Frequency  2x / week    PT Duration  6 weeks    PT Treatment/Interventions  Cryotherapy;Electrical Stimulation;Moist Heat;Traction;Ultrasound;Therapeutic exercise;Therapeutic activities;Neuromuscular re-education;Patient/family education;Dry needling;Passive range of motion;Manual techniques;Spinal Manipulations    PT Next Visit Plan  continue to work on C1-3 mobility, manual therapy, strength and flexibility    PT Home Exercise Plan  Access Code: [email or web address]    Consulted and Agree with Plan of Care  Patient       Patient will benefit from skilled therapeutic intervention in order to improve the following deficits and impairments:  Decreased range of motion, Increased fascial restricitons, Decreased activity tolerance, Pain, Decreased mobility  Visit Diagnosis: Other muscle spasm  Cervicalgia     Problem List Patient Active Problem List   Diagnosis Date Noted  . Angioedema of lips 07/12/2017  . Hypertension 07/12/2017  . Hyponatremia 07/12/2017  . Psoriasis   . Osteoarthritis of left hip 02/13/2017  . 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 Primary osteoarthritis of right hip 03/10/2014  . Status post total replacement of right hip 03/10/2014  . Diastasis recti 02/21/2013     Sigurd Sos, PT 04/11/19 2:48 PM  [facility] [address], [place], [place], [address] Phone: [phone]   Fax:  [phone]  Name: ZYHIR CAPPELLA MRN: [id number] Date of Birth: 1956-01-23

## 2019-04-13 ENCOUNTER — Ambulatory Visit: Payer: 59

## 2019-04-13 ENCOUNTER — Other Ambulatory Visit: Payer: Self-pay

## 2019-04-13 DIAGNOSIS — M62838 Other muscle spasm: Secondary | ICD-10-CM

## 2019-04-13 DIAGNOSIS — M542 Cervicalgia: Secondary | ICD-10-CM | POA: Diagnosis not present

## 2019-04-13 NOTE — Therapy (Signed)
[facility] [address] [place], [place], [address] Phone: [phone]   Fax:  [phone]  Physical Therapy Treatment  Patient Details  Name: Donald [name] MRN: [id number] Date of Birth: 02/05/1956 Referring Provider (PT): Dr. Farris Has   Encounter Date: 04/13/2019  PT End of Session - 04/13/19 1312    Visit Number  15    Date for PT Re-Evaluation  05/02/19    Authorization Type  UHC    PT Start Time  1232    PT Stop Time  1312    PT Time Calculation (min)  40 min    Activity Tolerance  Patient tolerated treatment well;No increased pain    Behavior During Therapy  WFL for tasks assessed/performed       Past Medical History:  Diagnosis Date  . Arthritis   . Asthma   . Hyperlipidemia   . Hypertension   . Sleep apnea    cpap settings ? 10     Past Surgical History:  Procedure Laterality Date  . HERNIA REPAIR     x 2   . SINUS EXPLORATION    . TOTAL HIP ARTHROPLASTY Right 03/10/2014   Procedure: RIGHT TOTAL HIP ARTHROPLASTY ANTERIOR APPROACH;  Surgeon: Kathryne Hitch, MD;  Location: [facility];  Service: Orthopedics;  Laterality: Right;  . TOTAL HIP ARTHROPLASTY Left 02/13/2017   Procedure: LEFT TOTAL HIP ARTHROPLASTY ANTERIOR APPROACH;  Surgeon: Kathryne Hitch, MD;  Location: [facility];  Service: Orthopedics;  Laterality: Left;  . URETEROSCOPY     multiple times     There were no vitals filed for this visit.  Subjective Assessment - 04/13/19 1232    Subjective  I have still been working on positioning at night.  I am sleeping better overall.    Currently in Pain?  Yes    Pain Score  1     Pain Location  Neck    Pain Orientation  Right    Pain Type  Acute pain    Pain Onset  More than a month ago    Pain Frequency  Intermittent    Aggravating Factors   sleep at night, moving head duing the day                       [facility] Adult PT Treatment/Exercise - 04/13/19  0001      Neck Exercises: Machines for Strengthening   UBE (Upper Arm Bike)  Level 2 x 6 minutes (3/3) while assessing patient   seat #10     Neck Exercises: Prone   Other Prone Exercise  quadrant stretch bil x10       Manual Therapy   Manual Therapy  Joint mobilization    Joint Mobilization  PA mobs C3-5 grade3 in prone.        Trigger Point Dry Needling - 04/13/19 0001    Consent Given?  Yes    Electrical Stimulation Performed with Dry Needling  Yes    E-stim with Dry Needling Details  to bil cervical multifidi x 5 minutes -attended by PT    Other Dry Needling  bil. sides of T1-T3    Oblique Capitus Response  Twitch response elicited;Palpable increased muscle length    Splenius capitus Response  Twitch reponse elicited;Palpable increased muscle length    Semispinalis capitus Response  Twitch reponse elicited;Palpable increased muscle length    Cervical multifidi Response  Twitch reponse elicited;Palpable increased muscle length  PT Short Term Goals - 03/10/19 1106      PT SHORT TERM GOAL #3   Title  understand ways to sleep with decreased cervical pain    Time  3    Period  Weeks    Status  Achieved    Target Date  03/15/19        PT Long Term Goals - 04/11/19 1408      PT LONG TERM GOAL #1   Title  independent with HEP and understand how to progress himself    Time  6    Period  Weeks    Status  On-going      PT LONG TERM GOAL #2   Title  cervical pain is minimal to no pain when turning his head to the left due to full ROM    Baseline  2-3/10    Time  6    Period  Weeks    Status  On-going      PT LONG TERM GOAL #3   Title  able to sleep throughout the night without cervical pain waking him up    Baseline  this is inconsistent    Time  6    Period  Weeks    Status  On-going            Plan - 04/13/19 1244    Clinical Impression Statement  Pt with main complaint is neck pain that is present at night.  Pt demonstrates full  cervical P/ROM motion. Pt is limited in bil quadrants with rotation and extension in prone.  Pt with tension and reduced mobility in upper cervical segments and multifidi.  Electrical stimulation was added to dry needling today to assist with multifidi relaxation to improve cervical A/ROM.  Pt is continuing to work on sleep positioning to improve sleep quality and quantity.  Pt will continue to benefit from skilled PT to address cervical mobility and reduced pain.    PT Frequency  2x / week    PT Duration  6 weeks    PT Treatment/Interventions  Cryotherapy;Electrical Stimulation;Moist Heat;Traction;Ultrasound;Therapeutic exercise;Therapeutic activities;Neuromuscular re-education;Patient/family education;Dry needling;Passive range of motion;Manual techniques;Spinal Manipulations    PT Next Visit Plan  continue to work on C1-3 mobility, manual therapy, strength and flexibility    PT Home Exercise Plan  Access Code: [email or web address]    Consulted and Agree with Plan of Care  Patient       Patient will benefit from skilled therapeutic intervention in order to improve the following deficits and impairments:  Decreased range of motion, Increased fascial restricitons, Decreased activity tolerance, Pain, Decreased mobility  Visit Diagnosis: Other muscle spasm  Cervicalgia     Problem List Patient Active Problem List   Diagnosis Date Noted  . Angioedema of lips 07/12/2017  . Hypertension 07/12/2017  . Hyponatremia 07/12/2017  . Psoriasis   . Osteoarthritis of left hip 02/13/2017  . 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 Primary osteoarthritis of right hip 03/10/2014  . Status post total replacement of right hip 03/10/2014  . Diastasis recti 02/21/2013    Donald [name], PT 04/13/19 1:16 PM  [facility] [address] [place], [place], [address] Phone: [phone]   Fax:  [phone]  Name: Donald [name] MRN:  [id number] Date of Birth: Feb 29, 1956

## 2019-04-18 ENCOUNTER — Ambulatory Visit: Payer: 59

## 2019-04-18 ENCOUNTER — Other Ambulatory Visit: Payer: Self-pay

## 2019-04-18 DIAGNOSIS — M62838 Other muscle spasm: Secondary | ICD-10-CM

## 2019-04-18 DIAGNOSIS — M542 Cervicalgia: Secondary | ICD-10-CM

## 2019-04-18 NOTE — Therapy (Signed)
[facility] [address] [place], [place], [address] Phone: [phone]   Fax:  [phone]  Physical Therapy Treatment  Patient Details  Name: Donald [name] MRN: [id number] Date of Birth: 1955/12/17 Referring Provider (PT): Dr. London Pepper   Encounter Date: 04/18/2019  PT End of Session - 04/18/19 1441    Visit Number  16    Date for PT Re-Evaluation  05/02/19    Authorization Type  UHC    PT Start Time  0102    PT Stop Time  7253    PT Time Calculation (min)  39 min    Activity Tolerance  Patient tolerated treatment well;No increased pain    Behavior During Therapy  WFL for tasks assessed/performed       Past Medical History:  Diagnosis Date  . Arthritis   . Asthma   . Hyperlipidemia   . Hypertension   . Sleep apnea    cpap settings ? 10     Past Surgical History:  Procedure Laterality Date  . HERNIA REPAIR     x 2   . SINUS EXPLORATION    . TOTAL HIP ARTHROPLASTY Right 03/10/2014   Procedure: RIGHT TOTAL HIP ARTHROPLASTY ANTERIOR APPROACH;  Surgeon: Mcarthur Rossetti, MD;  Location: [facility];  Service: Orthopedics;  Laterality: Right;  . TOTAL HIP ARTHROPLASTY Left 02/13/2017   Procedure: LEFT TOTAL HIP ARTHROPLASTY ANTERIOR APPROACH;  Surgeon: Mcarthur Rossetti, MD;  Location: [facility];  Service: Orthopedics;  Laterality: Left;  . URETEROSCOPY     multiple times     There were no vitals filed for this visit.  Subjective Assessment - 04/18/19 1405    Subjective  The electricity we used last time helped a lot- I was painfree for 2 days.  I think I have figured out what is hurting me.  It hurts when I sleep on my Rt.    Currently in Pain?  Yes    Pain Score  4     Pain Location  Neck    Pain Orientation  Right    Pain Descriptors / Indicators  Sharp    Pain Type  Acute pain    Pain Onset  More than a month ago    Pain Frequency  Intermittent    Aggravating Factors   sleep at night,  moving head during the day    Pain Relieving Factors  massaging it, stretching it out                       [facility] Adult PT Treatment/Exercise - 04/18/19 0001      Neck Exercises: Machines for Strengthening   UBE (Upper Arm Bike)  Level 2.5 x 6 minutes (3/3) while assessing patient      Neck Exercises: Supine   Other Supine Exercise  Melt Method: using foam roll    Other Supine Exercise  horizontal abduction and ER with blue band: 2x15 each      Neck Exercises: Prone   Other Prone Exercise  quadrant stretch bil x10       Manual Therapy   Manual Therapy  Joint mobilization    Joint Mobilization  PA mobs C3-5 grade 3 in supine with passive motion, quadrant stretch and lateral glides               PT Short Term Goals - 03/10/19 1106      PT SHORT TERM GOAL #3   Title  understand ways to sleep with decreased cervical pain    Time  3    Period  Weeks    Status  Achieved    Target Date  03/15/19        PT Long Term Goals - 04/18/19 1403      PT LONG TERM GOAL #2   Title  cervical pain is minimal to no pain when turning his head to the left due to full ROM            Plan - 04/18/19 1419    Clinical Impression Statement  Pt had a 2 day stretch after last session when he didn't have pain.  Pt continues to have pain in the morning and with sleep at night.  Pt reports that pain is worst with sleep on the Rt side.  He rolls on his side at night and wakes up because it hurts.  Session focused on upper cervical mobility,  postural strength and tissue mobility.  Pt with improved segmental mobility in the upper cervical segments.  Pt continues to work on flexibility and postural strength at home.  Pt will benefit from continued PT to address flexibility, postural strength and pain management.    PT Treatment/Interventions  Cryotherapy;Electrical Stimulation;Moist Heat;Traction;Ultrasound;Therapeutic exercise;Therapeutic activities;Neuromuscular  re-education;Patient/family education;Dry needling;Passive range of motion;Manual techniques;Spinal Manipulations    PT Next Visit Plan  continue to work on C1-3 mobility, manual therapy, strength and flexibility.  Dry needling to cervical with electrical stimulation    PT Home Exercise Plan  Access Code: [email or web address]    Consulted and Agree with Plan of Care  Patient       Patient will benefit from skilled therapeutic intervention in order to improve the following deficits and impairments:  Decreased range of motion, Increased fascial restricitons, Decreased activity tolerance, Pain, Decreased mobility  Visit Diagnosis: Cervicalgia  Other muscle spasm     Problem List Patient Active Problem List   Diagnosis Date Noted  . Angioedema of lips 07/12/2017  . Hypertension 07/12/2017  . Hyponatremia 07/12/2017  . Psoriasis   . Osteoarthritis of left hip 02/13/2017  . 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 Primary osteoarthritis of right hip 03/10/2014  . Status post total replacement of right hip 03/10/2014  . Diastasis recti 02/21/2013     [name], PT 04/18/19 2:42 PM  [facility] [address] [place], [place], [address] Phone: [phone]   Fax:  [phone]  Name: Donald [name] MRN: [id number] Date of Birth: May 21, 1955

## 2019-04-20 ENCOUNTER — Ambulatory Visit: Payer: 59

## 2019-04-20 ENCOUNTER — Other Ambulatory Visit: Payer: Self-pay

## 2019-04-20 DIAGNOSIS — M542 Cervicalgia: Secondary | ICD-10-CM

## 2019-04-20 DIAGNOSIS — M62838 Other muscle spasm: Secondary | ICD-10-CM

## 2019-04-20 NOTE — Therapy (Signed)
[facility] [address], [place], [place], [address] Phone: [phone]   Fax:  [phone]  Physical Therapy Treatment  Patient Details  Name: Donald [name] MRN: [id number] Date of Birth: [birth year] Referring Provider (PT): Dr. London Pepper   Encounter Date: 04/20/2019  PT End of Session - 04/20/19 1307    Visit Number  17    Date for PT Re-Evaluation  05/02/19    Authorization Type  UHC    PT Start Time  1232    PT Stop Time  1314    PT Time Calculation (min)  42 min    Activity Tolerance  Patient tolerated treatment well;No increased pain    Behavior During Therapy  WFL for tasks assessed/performed       Past Medical History:  Diagnosis Date  . Arthritis   . Asthma   . Hyperlipidemia   . Hypertension   . Sleep apnea    cpap settings ? 10     Past Surgical History:  Procedure Laterality Date  . HERNIA REPAIR     x 2   . SINUS EXPLORATION    . TOTAL HIP ARTHROPLASTY Right 03/10/2014   Procedure: RIGHT TOTAL HIP ARTHROPLASTY ANTERIOR APPROACH;  Surgeon: Mcarthur Rossetti, MD;  Location: [facility];  Service: Orthopedics;  Laterality: Right;  . TOTAL HIP ARTHROPLASTY Left 02/13/2017   Procedure: LEFT TOTAL HIP ARTHROPLASTY ANTERIOR APPROACH;  Surgeon: Mcarthur Rossetti, MD;  Location: [facility];  Service: Orthopedics;  Laterality: Left;  . URETEROSCOPY     multiple times     There were no vitals filed for this visit.  Subjective Assessment - 04/20/19 1232    Subjective  I slept good for the past 2 nights.  I woke up with pain today.  I feel better now.    Currently in Pain?  No/denies    Pain Score  --   in the morning 4/10   Pain Location  Neck    Pain Descriptors / Indicators  Sharp    Pain Type  Acute pain                       [facility] Adult PT Treatment/Exercise - 04/20/19 0001      Neck Exercises: Machines for Strengthening   UBE (Upper Arm Bike)  Level 2.5 x 6  minutes (3/3) while assessing patient      Neck Exercises: Supine   Other Supine Exercise  Melt Method: using foam roll    Other Supine Exercise  horizontal abduction and ER with blue band: 2x15 each      Manual Therapy   Manual Therapy  Joint mobilization    Joint Mobilization  PA mobs C3-5 grade3 in prone.        Trigger Point Dry Needling - 04/20/19 0001    Consent Given?  Yes    Electrical Stimulation Performed with Dry Needling  Yes    E-stim with Dry Needling Details  to bil cervical multifidi x 5 minutes -attended by PT    Other Dry Needling  bil. sides of T1-T3    Oblique Capitus Response  Twitch response elicited;Palpable increased muscle length    Splenius capitus Response  Twitch reponse elicited;Palpable increased muscle length    Semispinalis capitus Response  Twitch reponse elicited;Palpable increased muscle length    Cervical multifidi Response  Twitch reponse elicited;Palpable increased muscle length  PT Short Term Goals - 03/10/19 1106      PT SHORT TERM GOAL #3   Title  understand ways to sleep with decreased cervical pain    Time  3    Period  Weeks    Status  Achieved    Target Date  03/15/19        PT Long Term Goals - 04/18/19 1403      PT LONG TERM GOAL #2   Title  cervical pain is minimal to no pain when turning his head to the left due to full ROM            Plan - 04/20/19 1248    Clinical Impression Statement  Pt reports improved sleep over the past 2 nights and woke up with additional pain and stiffness this morning and rates the pain as 4/10.  Pt reported a nodule on the Rt sided of his neck.  PT palpated and it appears mobile and sphere like. This does not seem to be a part of the musculature.  Pt was advised to call his MD to have this checked.  Pt with tension and trigger points in suboccipitals and cervical multifidi.  Pt had good response to electrical stimulation paired with dry needling.  Pt reported reduced  stiffness after manual therapy today.  Pt will continue to benefit from skilled PT to address strength, flexibility and neck pain.    PT Frequency  2x / week    PT Duration  6 weeks    PT Treatment/Interventions  Cryotherapy;Electrical Stimulation;Moist Heat;Traction;Ultrasound;Therapeutic exercise;Therapeutic activities;Neuromuscular re-education;Patient/family education;Dry needling;Passive range of motion;Manual techniques;Spinal Manipulations    PT Next Visit Plan  continue to work on C1-3 mobility, manual therapy, strength and flexibility.  Dry needling to cervical with electrical stimulation if needed    PT Home Exercise Plan  Access Code: [email or web address]    Consulted and Agree with Plan of Care  Patient       Patient will benefit from skilled therapeutic intervention in order to improve the following deficits and impairments:  Decreased range of motion, Increased fascial restricitons, Decreased activity tolerance, Pain, Decreased mobility  Visit Diagnosis: Cervicalgia  Other muscle spasm     Problem List Patient Active Problem List   Diagnosis Date Noted  . Angioedema of lips 07/12/2017  . Hypertension 07/12/2017  . Hyponatremia 07/12/2017  . Psoriasis   . Osteoarthritis of left hip 02/13/2017  . 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 Primary osteoarthritis of right hip 03/10/2014  . Status post total replacement of right hip 03/10/2014  . Diastasis recti 02/21/2013   [name], PT 04/20/19 1:10 PM  [facility] [address], [address] [place], [place], [address] Phone: [phone]   Fax:  [phone]  Name: Donald [name] MRN: [id number] Date of Birth: 10-21-1955

## 2019-04-26 ENCOUNTER — Other Ambulatory Visit: Payer: Self-pay

## 2019-04-26 ENCOUNTER — Ambulatory Visit: Payer: 59

## 2019-04-26 DIAGNOSIS — M542 Cervicalgia: Secondary | ICD-10-CM

## 2019-04-26 DIAGNOSIS — M62838 Other muscle spasm: Secondary | ICD-10-CM

## 2019-04-26 NOTE — Therapy (Signed)
[facility] [address] [place], [place], [address] Phone: [phone]   Fax:  [phone]  Physical Therapy Treatment  Patient Details  Name: Donald [name] MRN: [id number] Date of Birth: Jul 02, 1955 Referring Provider (PT): Dr. London Pepper   Encounter Date: 04/26/2019  PT End of Session - 04/26/19 1308    Visit Number  18    Date for PT Re-Evaluation  05/02/19    Authorization Type  UHC    PT Start Time  1232    PT Stop Time  1314    PT Time Calculation (min)  42 min    Activity Tolerance  Patient tolerated treatment well;No increased pain    Behavior During Therapy  WFL for tasks assessed/performed       Past Medical History:  Diagnosis Date  . Arthritis   . Asthma   . Hyperlipidemia   . Hypertension   . Sleep apnea    cpap settings ? 10     Past Surgical History:  Procedure Laterality Date  . HERNIA REPAIR     x 2   . SINUS EXPLORATION    . TOTAL HIP ARTHROPLASTY Right 03/10/2014   Procedure: RIGHT TOTAL HIP ARTHROPLASTY ANTERIOR APPROACH;  Surgeon: Mcarthur Rossetti, MD;  Location: [facility];  Service: Orthopedics;  Laterality: Right;  . TOTAL HIP ARTHROPLASTY Left 02/13/2017   Procedure: LEFT TOTAL HIP ARTHROPLASTY ANTERIOR APPROACH;  Surgeon: Mcarthur Rossetti, MD;  Location: [facility];  Service: Orthopedics;  Laterality: Left;  . URETEROSCOPY     multiple times     There were no vitals filed for this visit.  Subjective Assessment - 04/26/19 1232    Subjective  I got a new pillow for Christmas.  I havent tried it yet.  I slept 2 nights without pain.    Currently in Pain?  Yes    Pain Score  4     Pain Location  Neck    Pain Orientation  Right    Pain Descriptors / Indicators  Sharp    Pain Type  Acute pain    Pain Onset  More than a month ago    Pain Frequency  Intermittent    Aggravating Factors   sleep at night, turning head during the day    Pain Relieving Factors  massaging  it, stretching it out         [facility] PT Assessment - 04/26/19 0001      Assessment   Medical Diagnosis  M54.2 Neck pain on right side      Observation/Other Assessments   Focus on Therapeutic Outcomes (FOTO)   41% limitation      AROM   Cervical - Right Side Bend  45    Cervical - Left Side Bend  40    Cervical - Right Rotation  85    Cervical - Left Rotation  80                   [facility] Adult PT Treatment/Exercise - 04/26/19 0001      Neck Exercises: Machines for Strengthening   UBE (Upper Arm Bike)  Level 2.5 x 6 minutes (3/3) while assessing patient      Neck Exercises: Stretches   Other Neck Stretches  cervical 3 ways: 3x 20 seconds each       Trigger Point Dry Needling - 04/26/19 0001    Consent Given?  Yes    Electrical Stimulation Performed with Dry Needling  Yes    E-stim with Dry Needling Details  to bil cervical multifidi x 12 minutes -attended by PT    Other Dry Needling  bil. sides of T1-T3    Oblique Capitus Response  Twitch response elicited;Palpable increased muscle length    Splenius capitus Response  Twitch reponse elicited;Palpable increased muscle length    Semispinalis capitus Response  Twitch reponse elicited;Palpable increased muscle length    Cervical multifidi Response  Twitch reponse elicited;Palpable increased muscle length             PT Short Term Goals - 03/10/19 1106      PT SHORT TERM GOAL #3   Title  understand ways to sleep with decreased cervical pain    Time  3    Period  Weeks    Status  Achieved    Target Date  03/15/19        PT Long Term Goals - 04/26/19 1236      PT LONG TERM GOAL #2   Title  cervical pain is minimal to no pain when turning his head to the left due to full ROM    Baseline  2-4/10    Time  6    Period  Weeks    Status  On-going      PT LONG TERM GOAL #3   Title  able to sleep throughout the night without cervical pain waking him up    Baseline  inconsistent- 85% overall improvement     Time  6    Period  Weeks    Status  On-going            Plan - 04/26/19 1304    Clinical Impression Statement  Pt reports 85% overall reduction in neck pain and stiffness since the start of care.  Pt reports inconsistent improvements in sleep.  Pt has not had any sleep interruptions in sleep over the 2 nights.  FOTO is improved to 41% limitation.  Pt with tension in bil cervical multifidi although this is significantly improved since the start of care.  Pt demonstrated improved A/ROM of the cervical spine with stiffness and pain at end range rotation and sidebending to the Rt.  Pt will attend 1 more session for flexibility, strength and manual therapy with probable discharge to HEP.    PT Frequency  2x / week    PT Duration  6 weeks    PT Treatment/Interventions  Cryotherapy;Electrical Stimulation;Moist Heat;Traction;Ultrasound;Therapeutic exercise;Therapeutic activities;Neuromuscular re-education;Patient/family education;Dry needling;Passive range of motion;Manual techniques;Spinal Manipulations    PT Next Visit Plan  C1-3 mobility, mobs and passive motion.  Review HEP    PT Home Exercise Plan  Access Code: [email or web address]    Consulted and Agree with Plan of Care  Patient       Patient will benefit from skilled therapeutic intervention in order to improve the following deficits and impairments:  Decreased range of motion, Increased fascial restricitons, Decreased activity tolerance, Pain, Decreased mobility  Visit Diagnosis: Cervicalgia  Other muscle spasm     Problem List Patient Active Problem List   Diagnosis Date Noted  . Angioedema of lips 07/12/2017  . Hypertension 07/12/2017  . Hyponatremia 07/12/2017  . Psoriasis   . Osteoarthritis of left hip 02/13/2017  . 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 Primary osteoarthritis of right hip 03/10/2014  . Status post total replacement of right hip 03/10/2014  . Diastasis recti 02/21/2013    Lorrene ReidKelly Jerianne Anselmo,  PT 04/26/19 1:18 PM  [facility] [address] [place], [place], [address] Phone: [phone]   Fax:  [phone]  Name: VARDAAN [name] MRN: [id number] Date of Birth: 10-05-1955

## 2019-04-27 ENCOUNTER — Ambulatory Visit: Payer: 59

## 2019-04-27 DIAGNOSIS — M542 Cervicalgia: Secondary | ICD-10-CM | POA: Diagnosis not present

## 2019-04-27 DIAGNOSIS — M62838 Other muscle spasm: Secondary | ICD-10-CM

## 2019-04-27 NOTE — Therapy (Signed)
[facility] [address] [place], [place], [address] Phone: [phone]   Fax:  [phone]  Physical Therapy Treatment  Patient Details  Name: MASIN SHATTO MRN: [id number] Date of Birth: April 25, 1956 Referring Provider (PT): Dr. London Pepper   Encounter Date: 04/27/2019  PT End of Session - 04/27/19 1310    Visit Number  19    PT Start Time  2542    PT Stop Time  7062    PT Time Calculation (min)  34 min       Past Medical History:  Diagnosis Date  . Arthritis   . Asthma   . Hyperlipidemia   . Hypertension   . Sleep apnea    cpap settings ? 10     Past Surgical History:  Procedure Laterality Date  . HERNIA REPAIR     x 2   . SINUS EXPLORATION    . TOTAL HIP ARTHROPLASTY Right 03/10/2014   Procedure: RIGHT TOTAL HIP ARTHROPLASTY ANTERIOR APPROACH;  Surgeon: Mcarthur Rossetti, MD;  Location: [facility];  Service: Orthopedics;  Laterality: Right;  . TOTAL HIP ARTHROPLASTY Left 02/13/2017   Procedure: LEFT TOTAL HIP ARTHROPLASTY ANTERIOR APPROACH;  Surgeon: Mcarthur Rossetti, MD;  Location: [facility];  Service: Orthopedics;  Laterality: Left;  . URETEROSCOPY     multiple times     There were no vitals filed for this visit.  Subjective Assessment - 04/27/19 1236    Subjective  Pt reports 85% overall improvement.  Pain is no longer waking me up at night. I just wake up and I feel stiff and have pain for the first hour.    Currently in Pain?  No/denies    Pain Score  --   early morning pain for the first hour        [facility] PT Assessment - 04/27/19 0001      Assessment   Medical Diagnosis  M54.2 Neck pain on right side      Observation/Other Assessments   Focus on Therapeutic Outcomes (FOTO)   41% limitation      AROM   Cervical - Right Side Bend  45    Cervical - Left Side Bend  40    Cervical - Right Rotation  85    Cervical - Left Rotation  80                   [facility]  Adult PT Treatment/Exercise - 04/27/19 0001      Neck Exercises: Machines for Strengthening   UBE (Upper Arm Bike)  Level 2.5 x 6 minutes (3/3) while assessing patient      Neck Exercises: Prone   Other Prone Exercise  quadrant stretch bil x10       Manual Therapy   Manual Therapy  Joint mobilization    Manual therapy comments  elongation and P/ROM with overpressure into bil rotation and sidebending    Joint Mobilization  sideglide and rotational mobs C2-5               PT Short Term Goals - 03/10/19 1106      PT SHORT TERM GOAL #3   Title  understand ways to sleep with decreased cervical pain    Time  3    Period  Weeks    Status  Achieved    Target Date  03/15/19        PT Long Term Goals - 04/27/19 1239  PT LONG TERM GOAL #1   Title  independent with HEP and understand how to progress himself    Status  Achieved      PT LONG TERM GOAL #2   Title  cervical pain is minimal to no pain when turning his head to the left due to full ROM    Baseline  2-4/10    Status  Partially Met      PT LONG TERM GOAL #3   Title  able to sleep throughout the night without cervical pain waking him up    Baseline  no longer waking with pain    Status  Achieved      PT LONG TERM GOAL #4   Title  FOTO score </= 35% limitation    Baseline  41% limitation    Status  Partially Met            Plan - 04/27/19 1310    Clinical Impression Statement  Pt reports 85% overall reduction in neck pain since the start of care.  Pt is now able to sleep a full night without waking due to pain.  Pt reports that he has pain/stiffness in the early AM hours and this resolves in the first 1-2 hours.  Pt with improved cervical A/ROM with Rt neck pain at end range motion.  Pt with reduced mobility in the upper cervical segments and has responded well to dry needling and mobilization.  Pt will follow-up with MD.    PT Next Visit Plan  D/C PT to HEP    PT Home Exercise Plan  Access Code:  [email or web address]    Consulted and Agree with Plan of Care  Patient       Patient will benefit from skilled therapeutic intervention in order to improve the following deficits and impairments:     Visit Diagnosis: Cervicalgia  Other muscle spasm     Problem List Patient Active Problem List   Diagnosis Date Noted  . Angioedema of lips 07/12/2017  . Hypertension 07/12/2017  . Hyponatremia 07/12/2017  . Psoriasis   . Osteoarthritis of left hip 02/13/2017  . 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 Primary osteoarthritis of right hip 03/10/2014  . Status post total replacement of right hip 03/10/2014  . Diastasis recti 02/21/2013  PHYSICAL THERAPY DISCHARGE SUMMARY  Visits from Start of Care: 19  Current functional level related to goals / functional outcomes: See above for current PT status.     Remaining deficits: Intermittent Rt sided neck pain and stiffness.     Education / Equipment: HEP, posture Plan: Patient agrees to discharge.  Patient goals were partially met. Patient is being discharged due to being pleased with the current functional level.  ?????       Sigurd Sos, PT 04/27/19 1:11 PM   [facility] [address], [place], [place], [address] Phone: [phone]   Fax:  [phone]  Name: SELMER ADDUCI MRN: [id number] Date of Birth: 08/15/1955

## 2020-09-13 ENCOUNTER — Other Ambulatory Visit: Payer: Self-pay | Admitting: Family Medicine

## 2020-09-13 DIAGNOSIS — N644 Mastodynia: Secondary | ICD-10-CM

## 2020-10-23 ENCOUNTER — Ambulatory Visit: Payer: 59

## 2020-10-23 ENCOUNTER — Other Ambulatory Visit: Payer: Self-pay

## 2020-10-23 ENCOUNTER — Ambulatory Visit
Admission: RE | Admit: 2020-10-23 | Discharge: 2020-10-23 | Disposition: A | Discharge status: Home / Self Care | Payer: 59 | Source: Ambulatory Visit | Attending: Family Medicine | Admitting: Family Medicine

## 2020-10-23 DIAGNOSIS — N644 Mastodynia: Secondary | ICD-10-CM

## 2021-01-09 ENCOUNTER — Other Ambulatory Visit: Payer: Self-pay | Admitting: Internal Medicine

## 2021-01-09 ENCOUNTER — Other Ambulatory Visit (HOSPITAL_COMMUNITY): Payer: Self-pay | Admitting: Internal Medicine

## 2021-03-29 ENCOUNTER — Ambulatory Visit (INDEPENDENT_AMBULATORY_CARE_PROVIDER_SITE_OTHER): Payer: 59 | Admitting: Podiatry

## 2021-03-29 ENCOUNTER — Other Ambulatory Visit: Payer: Self-pay

## 2021-03-29 DIAGNOSIS — L853 Xerosis cutis: Secondary | ICD-10-CM | POA: Diagnosis not present

## 2021-03-29 DIAGNOSIS — Z79899 Other long term (current) drug therapy: Secondary | ICD-10-CM | POA: Diagnosis not present

## 2021-03-29 DIAGNOSIS — B351 Tinea unguium: Secondary | ICD-10-CM

## 2021-03-29 MED ORDER — AMMONIUM LACTATE 12 % EX LOTN: 1.0000 "application " | TOPICAL_LOTION | CUTANEOUS | 0 refills | Status: DC | PRN

## 2021-04-04 NOTE — Progress Notes (Signed)
Subjective:  Patient ID: Donald [name], male    DOB: [birth year],  MRN: [id number]  Chief Complaint  Patient presents with   Foot Pain    Pt stated that he is here for a referral and does not think there is anything that can be done to help him  His skin is peeling     65 y.o. male presents with the above complaint.  Patient presents with thickened elongated dystrophic toenails x10.  Pain on palpation.  He states that it has been going on for quite some time is progressive gotten worse.  He is ready to take care of them by taking medication to help with it.  He also has secondary complaint of skin peeling without itching.  He states been going for quite some time.  He has not taken care of his foot.  He would like to discuss treatment options he has not seen anyone as prior to see me.  He denies any other acute complaints.  He is tried some over-the-counter stuff which has not helped.   Review of Systems: Negative except as noted in the HPI. Denies N/V/F/Ch.  Past Medical History:  Diagnosis Date   Arthritis    Asthma    Hyperlipidemia    Hypertension    Sleep apnea    cpap settings ? 10     Current Outpatient Medications:    ammonium lactate (AMLACTIN) 12 % lotion, Apply 1 application topically as needed for dry skin., Disp: 400 g, Rfl: 0   acetaminophen (TYLENOL) 500 MG tablet, Take 500-1,000 mg by mouth every 6 (six) hours as needed for moderate pain or headache., Disp: , Rfl:    albuterol (PROAIR HFA) 108 (90 BASE) MCG/ACT inhaler, Inhale 1-2 puffs into the lungs every 4 (four) hours as needed for wheezing or shortness of breath., Disp: , Rfl:    amLODipine (NORVASC) 10 MG tablet, Take 10 mg by mouth every morning. , Disp: , Rfl:    Apremilast 30 MG TABS, Take by mouth., Disp: , Rfl:    hydrochlorothiazide (HYDRODIURIL) 25 MG tablet, Take 25 mg by mouth every morning. , Disp: , Rfl:    hydroxypropyl methylcellulose / hypromellose (ISOPTO TEARS / GONIOVISC) 2.5 % ophthalmic  solution, Place 1 drop into both eyes 3 (three) times daily as needed for dry eyes., Disp: , Rfl:    ibuprofen (ADVIL,MOTRIN) 200 MG tablet, Take 200-400 mg by mouth every 6 (six) hours as needed for headache or moderate pain. , Disp: , Rfl:    losartan (COZAAR) 50 MG tablet, Take 50 mg by mouth daily., Disp: , Rfl:    metoprolol succinate (TOPROL-XL) 25 MG 24 hr tablet, Take 25 mg by mouth daily., Disp: , Rfl:    mometasone (ASMANEX) 220 MCG/INH inhaler, Inhale 2 puffs into the lungs daily., Disp: , Rfl:    POTASSIUM PO, Take 2 tablets by mouth daily as needed (for heart racing/cramping). , Disp: , Rfl:    sodium chloride (OCEAN) 0.65 % SOLN nasal spray, Place 2-3 sprays into both nostrils daily as needed for congestion. , Disp: , Rfl:    spironolactone (ALDACTONE) 25 MG tablet, Take 25 mg by mouth daily., Disp: , Rfl:    VECTICAL 3 MCG/GM cream, Apply 1 application topically daily as needed (for psoriasis). , Disp: , Rfl:   Social History   Tobacco Use  Smoking Status Former   Packs/day: 3.00   Types: Cigarettes   Quit date: 1981   Years since quitting: 47.9  Smokeless Tobacco Never  Tobacco Comments   patient quit smoking around 25    Allergies  Allergen Reactions   Lisinopril Other (See Comments)    Angioedema   Prednisone Swelling   Tetracyclines & Related Nausea Only   Objective:  There were no vitals filed for this visit. There is no height or weight on file to calculate BMI. Constitutional Well developed. Well nourished.  Vascular Dorsalis pedis pulses palpable bilaterally. Posterior tibial pulses palpable bilaterally. Capillary refill normal to all digits.  No cyanosis or clubbing noted. Pedal hair growth normal.  Neurologic Normal speech. Oriented to person, place, and time. Epicritic sensation to light touch grossly present bilaterally.  Dermatologic Nails thickened and negative dystrophic toenails x10.  Mycotic nature to it. Skin dry skin without subjective  component of itching noted.  No skin fissures noted.  Orthopedic: Normal joint ROM without pain or crepitus bilaterally. No visible deformities. No bony tenderness.   Radiographs: None Assessment:   1. Long-term use of high-risk medication   2. Xerosis of skin   3. Nail fungus   4. Onychomycosis due to dermatophyte    Plan:  Patient was evaluated and treated and all questions answered.  Onychomycosis x10 -Educated the patient on the etiology of onychomycosis and various treatment options associated with improving the fungal load.  I explained to the patient that there is 3 treatment options available to treat the onychomycosis including topical, p.o., laser treatment.  Patient elected to undergo p.o. options with Lamisil/terbinafine therapy.  In order for me to start the medication therapy, I explained to the patient the importance of evaluating the liver and obtaining the liver function test.  Once the liver function test comes back normal I will start him on 40-month course of Lamisil therapy.  Patient understood all risk and would like to proceed with Lamisil therapy.  I have asked the patient to immediately stop the Lamisil therapy if she has any reactions to it and call the office or go to the emergency room right away.  Patient states understanding   Xerosis bilateral skin -I explained to the patient the etiology of xerosis and various treatment options were extensively discussed.  I explained to the patient the importance of maintaining moisturization of the skin with application of over-the-counter lotion such as Eucerin or Luciderm.  Given that he has failed all over-the-counter medication I believe patient will benefit from ammonium lactate.  Ammonium lactate was sent to the pharmacy of asked him to apply twice a day.  He states understanding   No follow-ups on file.

## 2021-09-07 IMAGING — MG DIGITAL DIAGNOSTIC BILAT W/ TOMO W/ CAD
6 of 10 series · 6 of 30 positions shown · non-contrast
Comparison: None.

CLINICAL DATA: Male patient with recent RIGHT nipple pain/fullness,
symptoms now mostly resolved.

EXAM:
DIGITAL DIAGNOSTIC BILATERAL MAMMOGRAM WITH TOMOSYNTHESIS AND CAD
TECHNIQUE: Bilateral digital diagnostic mammography and breast tomosynthesis
was performed. The images were evaluated with computer-aided
detection.

[R MLO synth-2D (1 of 2)]
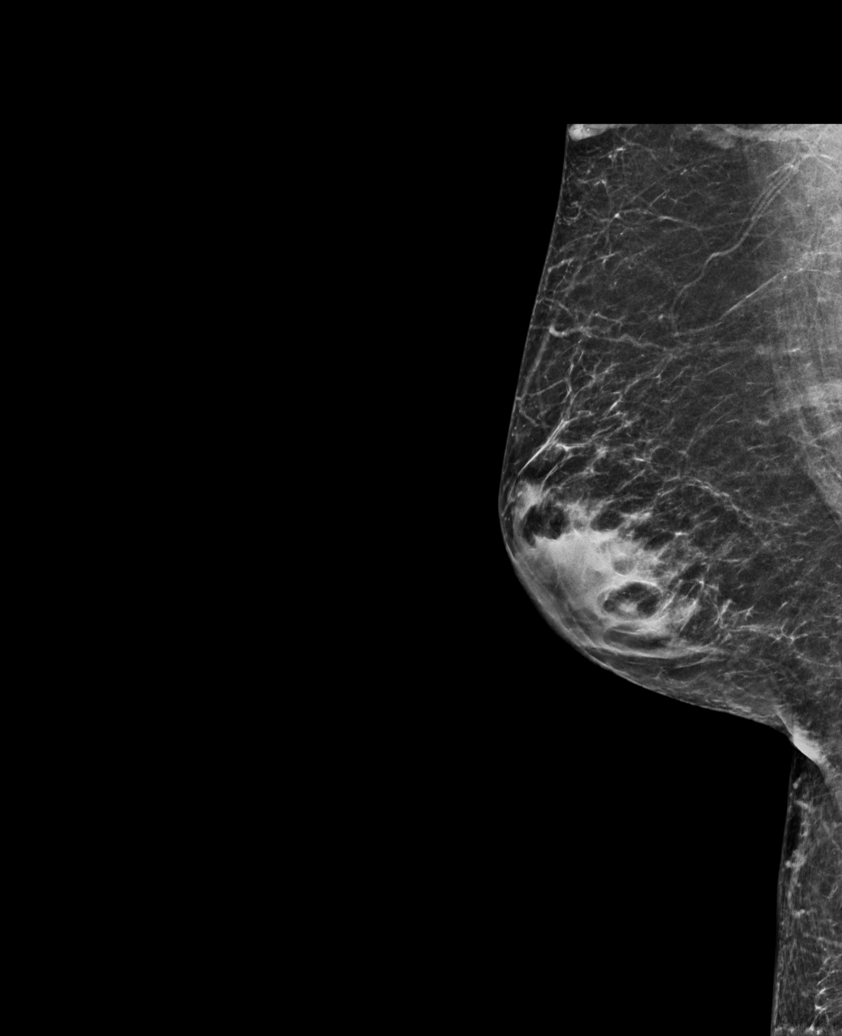

[R CC synth-2D]
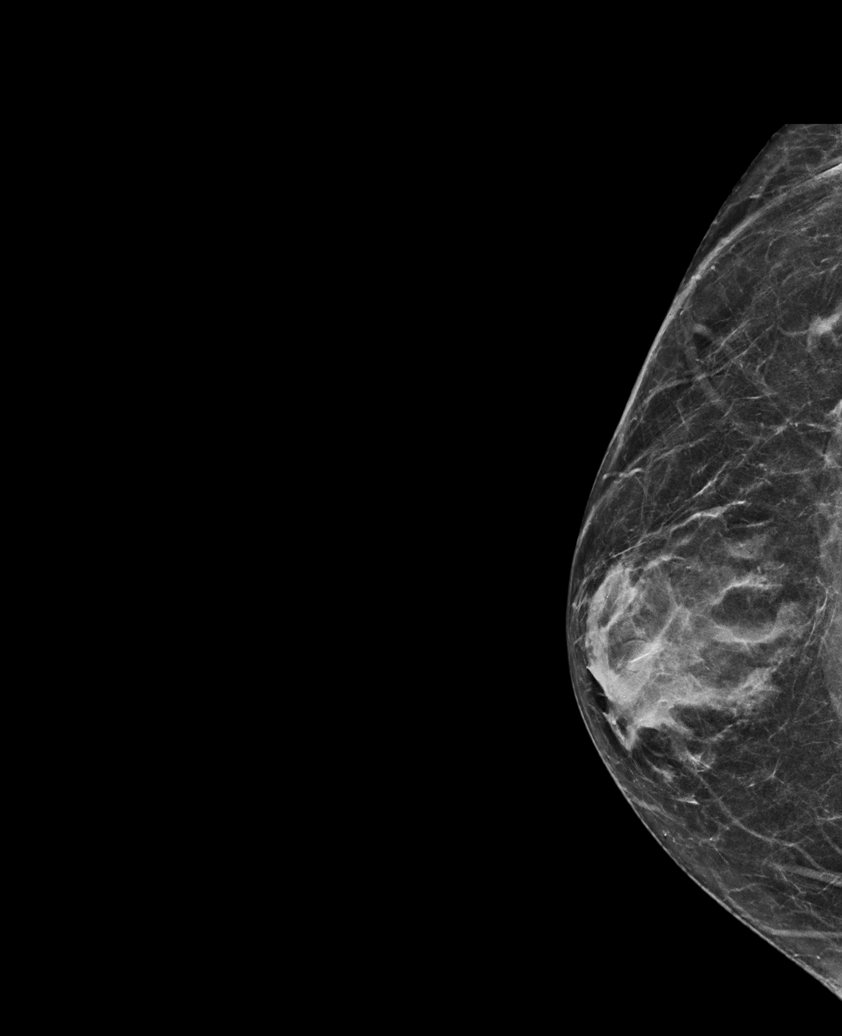

[R MLO synth-2D (2 of 2)]
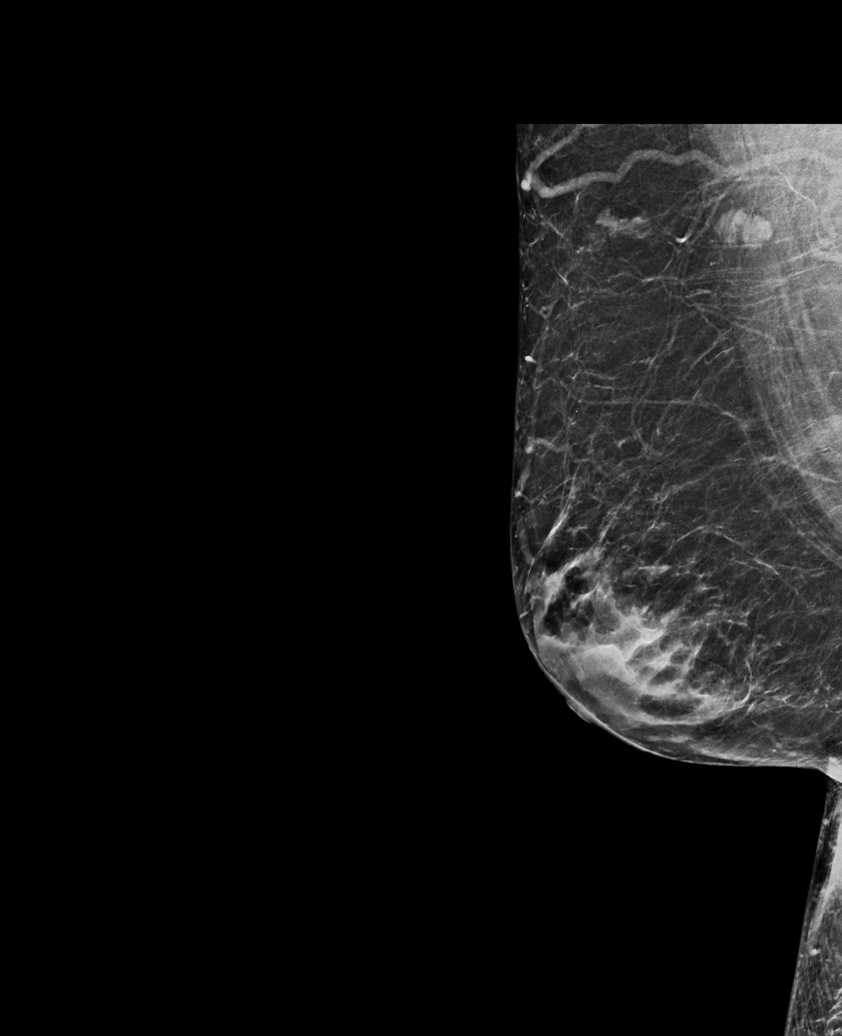

[L CC synth-2D]
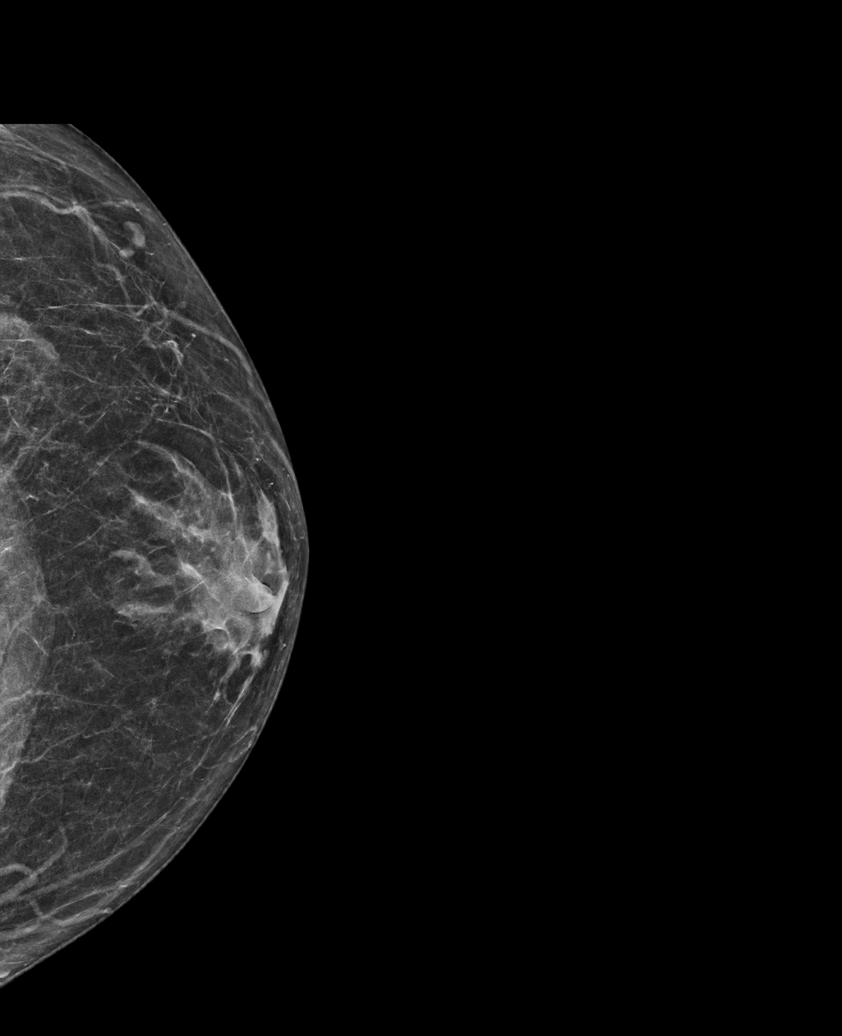

[L MLO synth-2D]
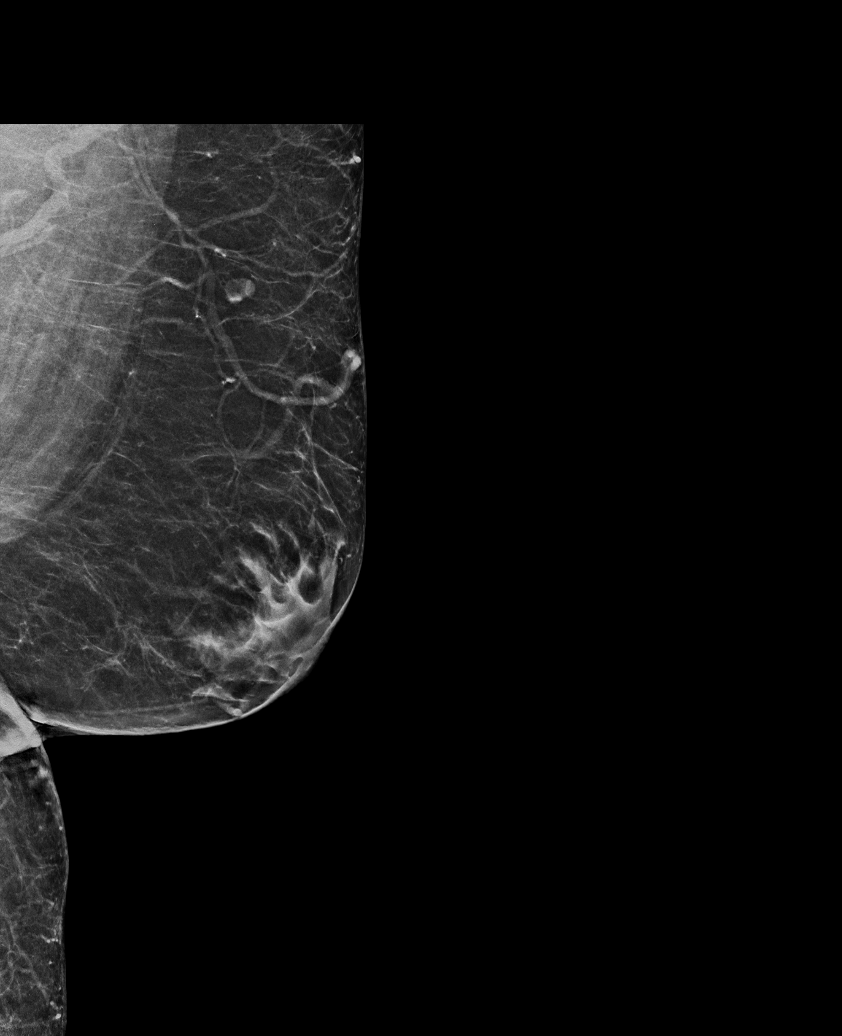

[R MLO tomo · tomo slice 28/55.0]
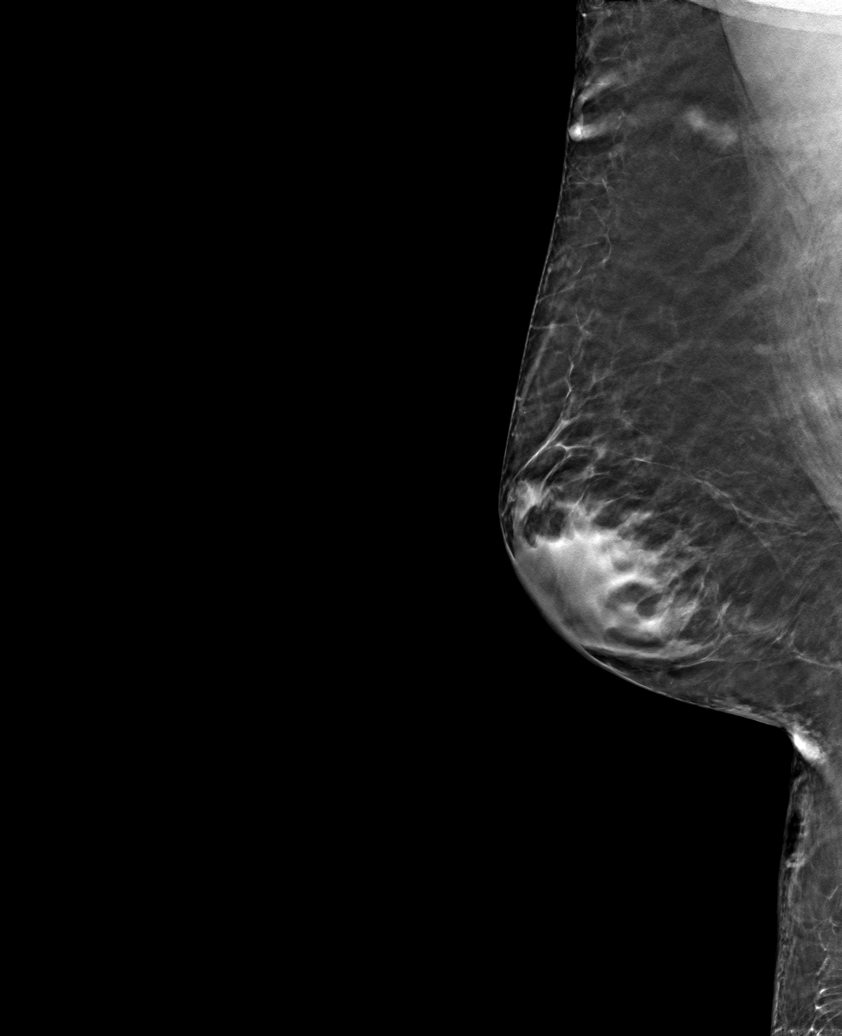

[6 of 30 positions shown; findings below may reference images not displayed]

ACR Breast Density Category c: The breast tissue is heterogeneously
dense, which may obscure small masses.
FINDINGS: There is bilateral gynecomastia, predominantly dendritic type, at
least moderate in degree. There are no dominant masses, suspicious
calcifications or secondary signs of malignancy within either
breast. No evidence of soft tissue edema within either breast.
IMPRESSION: 1. Bilateral benign gynecomastia, at least moderate in degree.
2. No evidence of malignancy within either breast.

RECOMMENDATION:
1. Clinical follow-up.
2. I discussed with the patient the fact that gynecomastia can occur
in older men as testosterone levels decrease with age or in younger
men with low testosterone levels, causing a change in the serum
testosterone:estrogen ratio. We also discussed other potential
etiologies of gynecomastia including numerous prescription
medications and chronic liver disease. Recreational drugs (marijuana
and anabolic steroids in particular) can also cause gynecomastia.
Approximately 20% of cases of gynecomastia are idiopathic. The
patient should return for additional imaging if a new palpable
abnormality is identified in either breast. We also discussed the
possibility of surgical excision if symptoms continue and if an
etiology of the gynecomastia cannot be determined and therefore
corrected.

I have discussed the findings and recommendations with the patient.
If applicable, a reminder letter will be sent to the patient
regarding the next appointment.

BI-RADS CATEGORY  2: Benign.

## 2021-10-10 ENCOUNTER — Other Ambulatory Visit: Payer: Self-pay | Admitting: Home Modifications

## 2021-10-10 ENCOUNTER — Other Ambulatory Visit (HOSPITAL_COMMUNITY): Payer: Self-pay | Admitting: Home Modifications

## 2021-10-10 DIAGNOSIS — E210 Primary hyperparathyroidism: Secondary | ICD-10-CM

## 2021-11-01 ENCOUNTER — Encounter (HOSPITAL_COMMUNITY)
Admission: RE | Admit: 2021-11-01 | Discharge: 2021-11-01 | Disposition: A | Discharge status: Home / Self Care | Payer: 59 | Source: Ambulatory Visit | Attending: Home Modifications | Admitting: Home Modifications

## 2021-11-01 DIAGNOSIS — E210 Primary hyperparathyroidism: Secondary | ICD-10-CM | POA: Diagnosis not present

## 2021-11-01 MED ORDER — TECHNETIUM TC 99M SESTAMIBI - CARDIOLITE
27.1000 | Freq: Once | INTRAVENOUS | Status: AC | PRN
  Administered 2021-11-01: 27.1 via INTRAVENOUS

## 2021-11-04 ENCOUNTER — Ambulatory Visit
Admission: RE | Admit: 2021-11-04 | Discharge: 2021-11-04 | Disposition: A | Discharge status: Home / Self Care | Payer: 59 | Source: Ambulatory Visit | Attending: Gastroenterology | Admitting: Gastroenterology

## 2021-11-04 ENCOUNTER — Other Ambulatory Visit: Payer: Self-pay | Admitting: Gastroenterology

## 2021-11-04 DIAGNOSIS — R195 Other fecal abnormalities: Secondary | ICD-10-CM

## 2022-02-07 ENCOUNTER — Other Ambulatory Visit: Payer: Self-pay | Admitting: Family Medicine

## 2022-02-07 ENCOUNTER — Ambulatory Visit
Admission: RE | Admit: 2022-02-07 | Discharge: 2022-02-07 | Disposition: A | Discharge status: Home / Self Care | Payer: 59 | Source: Ambulatory Visit | Attending: Family Medicine | Admitting: Family Medicine

## 2022-02-07 DIAGNOSIS — R52 Pain, unspecified: Secondary | ICD-10-CM

## 2022-02-18 NOTE — Therapy (Signed)
OUTPATIENT PHYSICAL THERAPY THORACOLUMBAR EVALUATION   Patient Name: Donald [name] MRN: [id number] DOB:August 08, 1955, 66 y.o., male Today's Date: 02/19/2022   PT End of Session - 02/19/22 1327     Visit Number 1    Date for PT Re-Evaluation 04/16/22    Authorization Type [employer]    PT Start Time 1241    PT Stop Time 1315    PT Time Calculation (min) 34 min    Activity Tolerance Patient tolerated treatment well    Behavior During Therapy WFL for tasks assessed/performed             Past Medical History:  Diagnosis Date   Arthritis    Asthma    Hyperlipidemia    Hypertension    Sleep apnea    cpap settings ? 10    Past Surgical History:  Procedure Laterality Date   HERNIA REPAIR     x 2    SINUS EXPLORATION     TOTAL HIP ARTHROPLASTY Right 03/10/2014   Procedure: RIGHT TOTAL HIP ARTHROPLASTY ANTERIOR APPROACH;  Surgeon: Kathryne Hitch, MD;  Location: [facility];  Service: Orthopedics;  Laterality: Right;   TOTAL HIP ARTHROPLASTY Left 02/13/2017   Procedure: LEFT TOTAL HIP ARTHROPLASTY ANTERIOR APPROACH;  Surgeon: Kathryne Hitch, MD;  Location: [facility];  Service: Orthopedics;  Laterality: Left;   URETEROSCOPY     multiple times    Patient Active Problem List   Diagnosis Date Noted   Angioedema of lips 07/12/2017   Hypertension 07/12/2017   Hyponatremia 07/12/2017   Psoriasis    Osteoarthritis of left hip 02/13/2017   Status post total replacement of left hip 02/13/2017   Primary osteoarthritis of right hip 03/10/2014   Status post total replacement of right hip 03/10/2014   Diastasis recti 02/21/2013    PCP: Farris Has, MD  REFERRING PROVIDER: Jorge Ny, NP  REFERRING DIAG: M54.50 (ICD-10-CM) - Low back pain, unspecified  Rationale for Evaluation and Treatment Rehabilitation  THERAPY DIAG:  Other low back pain - Plan: PT plan of care cert/re-cert  Cramp and spasm - Plan: PT plan of care cert/re-cert  Abnormal posture - Plan: PT plan  of care cert/re-cert  Muscle weakness (generalized) - Plan: PT plan of care cert/re-cert  Pain in thoracic spine - Plan: PT plan of care cert/re-cert  ONSET DATE: 01/2022  SUBJECTIVE:                                                                                                                                                                                           SUBJECTIVE STATEMENT: Pt presents to PT with acute onset of  LBP Rt>Lt.  He reports that he has recently moved and has been doing a lot of lifting.  He lifting a box that was too heavy and felt a pop in his back ~4 weeks ago. Pt denies any radiculopathy.  He has been managing pain with ibuprofen without much result.  Pt reports that most recently, he has been having pain in Lt thoracic spine and shoulder.    PERTINENT HISTORY:  Asthma, HTN, sleep apnea   PAIN:  Are you having pain? Yes: NPRS scale: 3/10  Pain location: Low back  Pain description: sore, searing  Aggravating factors: getting out of bed, standing, lifting  Relieving factors: muscle relaxers   PRECAUTIONS: None  WEIGHT BEARING RESTRICTIONS: No  FALLS:  Has patient fallen in last 6 months? Yes. Number of falls many in the past year (PT will address)  LIVING ENVIRONMENT: Lives with: lives with their family Lives in: House/apartment Stairs: Yes: External: 2 steps; on right going up Has following equipment at home: None  OCCUPATION: retired   PLOF: Independent  PATIENT GOALS: reduce LBP/thoracic pain   OBJECTIVE:   DIAGNOSTIC FINDINGS:  X-ray: IMPRESSION: 1. No acute fracture. 2. Mild anterolisthesis at L4-L5. 3. Degenerative changes in the lower lumbar spine.  PATIENT SURVEYS:  FOTO 3 (65 is goal)  SCREENING FOR RED FLAGS: Bowel or bladder incontinence: No Spinal tumors: No Cauda equina syndrome: No Compression fracture: No Abdominal aneurysm: No  COGNITION: Overall cognitive status: Within functional limits for tasks  assessed     SENSATION: WFL  MUSCLE LENGTH: Limited by 25%   POSTURE: rounded shoulders, forward head, and flexed trunk   PALPATION: No significant palpable tenderness today.   LUMBAR ROM:   AROM eval  Flexion   Extension   Right lateral flexion   Left lateral flexion   Right rotation   Left rotation    (Blank rows = not tested)  LOWER EXTREMITY ROM:    Limited by 25% bilaterally  LOWER EXTREMITY MMT:   Hip 4/5, knee 4+/5, bil shoulders 4+/5 Bed Mobility: max effort from pt and required moderate verbal cues for log roll  FUNCTIONAL TESTS:  5 times sit to stand: 21.29  GAIT: Distance walked: 100 Assistive device utilized: None Level of assistance: Modified independence Comments: slow mobility, flexed trunk  TODAY'S TREATMENT:                                                                                                                              DATE:02/19/22  PATIENT EDUCATION:  Education details: Access Code: [email or web address] Person educated: Patient Education method: Consulting civil engineer, Media planner, and Handouts Education comprehension: verbalized understanding and returned demonstration  HOME EXERCISE PROGRAM: Access Code: [email or web address] URL: [email or web address] Date: 02/19/2022 Prepared by: Claiborne Billings  Exercises - Supine Lower Trunk Rotation  - 3 x daily - 7 x weekly - 1 sets - 3 reps - 20 hold - Seated Hamstring Stretch  - 3 x daily - 7 x weekly -  1 sets - 3 reps - 20 hold - Sit to Stand Without Arm Support  - 2 x daily - 7 x weekly - 2 sets - 10 reps - Seated Scapular Retraction  - 5 x daily - 7 x weekly - 1 sets - 10 reps - Seated Upright Posture Correction  - 1 x daily - 7 x weekly - 1 sets - 10 reps  ASSESSMENT:  CLINICAL IMPRESSION: Patient is a 66 y.o. male who was seen today for physical therapy evaluation and treatment for LBP that began ~ 1 month ago after lifting a heavy box. Pt reports that he mostly has pain with getting out of bed and  standing.  He has not been lifting since the pain began.  Pt reports that pain has been more localized to his thoracic spine and Lt scapula over the past few days. Pt also reports that he has chronic balance deficits with frequent falls over the past year.  Pt performed 5x sit to stand in 21.29 seconds, indicating falls risk.  He has poor posture throughout and requires max effort with be mobility with reports of pain with this movement.  Patient will benefit from skilled PT to address the below impairments and improve overall function.   OBJECTIVE IMPAIRMENTS: decreased activity tolerance, decreased balance, difficulty walking, decreased strength, increased muscle spasms, impaired flexibility, improper body mechanics, postural dysfunction, and pain.   ACTIVITY LIMITATIONS: carrying, lifting, squatting, bed mobility, and locomotion level  PARTICIPATION LIMITATIONS: cleaning, laundry, and community activity  PERSONAL FACTORS: Age are also affecting patient's functional outcome.   REHAB POTENTIAL: Good  CLINICAL DECISION MAKING: Stable/uncomplicated  EVALUATION COMPLEXITY: Low   GOALS: Goals reviewed with patient? Yes  SHORT TERM GOALS: Target date: 03/19/2022  Be independent in initial HEP Baseline: Goal status: INITIAL  2.  Verbalize and demonstrate body mechanics modifications for lumbar protection with ADLs and self-care Baseline:  Goal status: INITIAL  3.  Report > or = to 30% reduction in LBP with ADLs and self-care Baseline:  Goal status: INITIAL    LONG TERM GOALS: Target date: 04/16/2022  Be independent in advanced HEP Baseline:  Goal status: INITIAL  2.  Improve FOTO to > or = to  Baseline:  Goal status: INITIAL  3.  Perform 5x sit to stand in < or = to 13 seconds to improve balance  Baseline: 21 seconds  Goal status: INITIAL  4.  Return to lifting and carrying for household tasks without limitation due to LBP Baseline:  Goal status: INITIAL  5.  Report >  or = to 70% reduction in LBP with self-care and ADLs Baseline:  Goal status: INITIAL  6.  Perform bed mobility with min effort and report no pain with this  Baseline:  Goal status: INITIAL  PLAN:  PT FREQUENCY: 1-2x/week  PT DURATION: 8 weeks  PLANNED INTERVENTIONS: Therapeutic exercises, Therapeutic activity, Neuromuscular re-education, Balance training, Gait training, Patient/Family education, Self Care, Joint mobilization, Joint manipulation, Aquatic Therapy, Dry Needling, Electrical stimulation, Spinal manipulation, Spinal mobilization, Cryotherapy, Moist heat, Taping, Traction, Ultrasound, Manual therapy, and Re-evaluation.  PLAN FOR NEXT SESSION: review HEP, body mechanics education, work on bed mobility, balance tasks that incorporate core   [name], PT 02/19/22 1:28 PM   [facility] [address], [address] [place], [place] [address] Phone # [phone] Fax [phone]

## 2022-02-19 ENCOUNTER — Ambulatory Visit: Payer: 59 | Attending: Family Medicine

## 2022-02-19 ENCOUNTER — Other Ambulatory Visit: Payer: Self-pay

## 2022-02-19 DIAGNOSIS — M546 Pain in thoracic spine: Secondary | ICD-10-CM | POA: Insufficient documentation

## 2022-02-19 DIAGNOSIS — M5459 Other low back pain: Secondary | ICD-10-CM

## 2022-02-19 DIAGNOSIS — M545 Low back pain, unspecified: Secondary | ICD-10-CM | POA: Diagnosis not present

## 2022-02-19 DIAGNOSIS — R293 Abnormal posture: Secondary | ICD-10-CM | POA: Insufficient documentation

## 2022-02-19 DIAGNOSIS — M6281 Muscle weakness (generalized): Secondary | ICD-10-CM | POA: Insufficient documentation

## 2022-02-19 DIAGNOSIS — R252 Cramp and spasm: Secondary | ICD-10-CM

## 2022-03-04 ENCOUNTER — Ambulatory Visit: Payer: 59 | Attending: Family Medicine

## 2022-03-04 DIAGNOSIS — M5459 Other low back pain: Secondary | ICD-10-CM | POA: Diagnosis not present

## 2022-03-04 DIAGNOSIS — R293 Abnormal posture: Secondary | ICD-10-CM

## 2022-03-04 DIAGNOSIS — M546 Pain in thoracic spine: Secondary | ICD-10-CM | POA: Diagnosis present

## 2022-03-04 DIAGNOSIS — M6281 Muscle weakness (generalized): Secondary | ICD-10-CM

## 2022-03-04 DIAGNOSIS — R252 Cramp and spasm: Secondary | ICD-10-CM | POA: Diagnosis present

## 2022-03-04 NOTE — Therapy (Signed)
OUTPATIENT PHYSICAL THERAPY TREATMENT   Patient Name: Donald [name] MRN: [id number] DOB:October 12, 1955, 66 y.o., male Today's Date: 03/04/2022   PT End of Session - 03/04/22 1317     Visit Number 2    Date for PT Re-Evaluation 04/16/22    Authorization Type [employer]    PT Start Time 1246    PT Stop Time 1318    PT Time Calculation (min) 32 min    Activity Tolerance Patient tolerated treatment well    Behavior During Therapy WFL for tasks assessed/performed              Past Medical History:  Diagnosis Date   Arthritis    Asthma    Hyperlipidemia    Hypertension    Sleep apnea    cpap settings ? 10    Past Surgical History:  Procedure Laterality Date   HERNIA REPAIR     x 2    SINUS EXPLORATION     TOTAL HIP ARTHROPLASTY Right 03/10/2014   Procedure: RIGHT TOTAL HIP ARTHROPLASTY ANTERIOR APPROACH;  Surgeon: Mcarthur Rossetti, MD;  Location: [facility];  Service: Orthopedics;  Laterality: Right;   TOTAL HIP ARTHROPLASTY Left 02/13/2017   Procedure: LEFT TOTAL HIP ARTHROPLASTY ANTERIOR APPROACH;  Surgeon: Mcarthur Rossetti, MD;  Location: [facility];  Service: Orthopedics;  Laterality: Left;   URETEROSCOPY     multiple times    Patient Active Problem List   Diagnosis Date Noted   Angioedema of lips 07/12/2017   Hypertension 07/12/2017   Hyponatremia 07/12/2017   Psoriasis    Osteoarthritis of left hip 02/13/2017   Status post total replacement of left hip 02/13/2017   Primary osteoarthritis of right hip 03/10/2014   Status post total replacement of right hip 03/10/2014   Diastasis recti 02/21/2013    PCP: London Pepper, MD  REFERRING PROVIDER: Windy Carina, NP  REFERRING DIAG: M54.50 (ICD-10-CM) - Low back pain, unspecified  Rationale for Evaluation and Treatment Rehabilitation  THERAPY DIAG:  Other low back pain  Cramp and spasm  Abnormal posture  Muscle weakness (generalized)  Pain in thoracic spine  ONSET DATE: 01/2022  SUBJECTIVE:                                                                                                                                                                                            SUBJECTIVE STATEMENT: Pt arrived late.  He has moved to [facility] and didn't realize how long the drive is.  Feeling better since last session. Lost his exercises and hasn't been doing them.  MD prescribed pain meds that are helping.    PERTINENT  HISTORY:  Asthma, HTN, sleep apnea   PAIN:  Are you having pain? Yes: NPRS scale: 3/10  Pain location: Low back  Pain description: sore, searing  Aggravating factors: getting out of bed, standing, lifting  Relieving factors: muscle relaxers   PRECAUTIONS: None  WEIGHT BEARING RESTRICTIONS: No  FALLS:  Has patient fallen in last 6 months? Yes. Number of falls many in the past year (PT will address)  LIVING ENVIRONMENT: Lives with: lives with their family Lives in: House/apartment Stairs: Yes: External: 2 steps; on right going up Has following equipment at home: None  OCCUPATION: retired   PLOF: Independent  PATIENT GOALS: reduce LBP/thoracic pain   OBJECTIVE:   DIAGNOSTIC FINDINGS:  X-ray: IMPRESSION: 1. No acute fracture. 2. Mild anterolisthesis at L4-L5. 3. Degenerative changes in the lower lumbar spine.  PATIENT SURVEYS:  FOTO 75 (37 is goal)  SCREENING FOR RED FLAGS: Bowel or bladder incontinence: No Spinal tumors: No Cauda equina syndrome: No Compression fracture: No Abdominal aneurysm: No  COGNITION: Overall cognitive status: Within functional limits for tasks assessed     SENSATION: WFL  MUSCLE LENGTH: Limited by 25%   POSTURE: rounded shoulders, forward head, and flexed trunk   PALPATION: No significant palpable tenderness today.   LUMBAR ROM:   AROM eval  Flexion   Extension   Right lateral flexion   Left lateral flexion   Right rotation   Left rotation    (Blank rows = not tested)  LOWER EXTREMITY ROM:     Limited by 25% bilaterally  LOWER EXTREMITY MMT:   Hip 4/5, knee 4+/5, bil shoulders 4+/5 Bed Mobility: max effort from pt and required moderate verbal cues for log roll  FUNCTIONAL TESTS:  5 times sit to stand: 21.29  GAIT: Distance walked: 100 Assistive device utilized: None Level of assistance: Modified independence Comments: slow mobility, flexed trunk  Date: 03/04/22 NuStep: Level 3x 7 minutes-PT present to discuss progress Seated hamstring stretch 3x20 seconds  Sit to stand 2x10- verbal cues for controlled descent Low trunk rotation 3x20 seconds  Supine ER with red theraband 2x10 Heel raises 2x10 Standing hip abduction bil 2x10- verbal cues for core activation and alignment  TODAY'S TREATMENT:                                                                                                                              DATE:02/19/22  PATIENT EDUCATION:  Education details: Access Code: [email or web address] Person educated: Patient Education method: Explanation, Demonstration, and Handouts Education comprehension: verbalized understanding and returned demonstration  HOME EXERCISE PROGRAM: Access Code: [email or web address] URL: [email or web address] Date: 03/04/2022 Prepared by: Claiborne Billings  Exercises - Supine Lower Trunk Rotation  - 3 x daily - 7 x weekly - 1 sets - 3 reps - 20 hold - Seated Hamstring Stretch  - 3 x daily - 7 x weekly - 1 sets - 3 reps - 20 hold - Sit to Stand Without Arm Support  - 2 x  daily - 7 x weekly - 2 sets - 10 reps - Seated Scapular Retraction  - 5 x daily - 7 x weekly - 1 sets - 10 reps - Seated Upright Posture Correction  - 1 x daily - 7 x weekly - 1 sets - 10 reps - Standing Heel Raise with Chair Support  - 2 x daily - 7 x weekly - 2 sets - 10 reps - Standing Hip Abduction with Counter Support  - 1 x daily - 7 x weekly - 2 sets - 10 reps - Supine Bilateral Shoulder External Rotation with Resistance  - 2 x daily - 7 x weekly - 2 sets - 10  reps  ASSESSMENT:  CLINICAL IMPRESSION: Pt lost his exercises while he moving so hasn't been doing his HEP.  PT reprinted to include new exercises added today.  Pt denies any lumbar pain since last session due to new medication prescribed by MD.  Pt required intermittent tactile cues for posture, alignment and weight shifting. Pt demonstrated improved bed mobility today. Pt is planning to call [facility] in [place] to try to schedule appts there.  Patient will benefit from skilled PT to address the below impairments and improve overall function.   OBJECTIVE IMPAIRMENTS: decreased activity tolerance, decreased balance, difficulty walking, decreased strength, increased muscle spasms, impaired flexibility, improper body mechanics, postural dysfunction, and pain.   ACTIVITY LIMITATIONS: carrying, lifting, squatting, bed mobility, and locomotion level  PARTICIPATION LIMITATIONS: cleaning, laundry, and community activity  PERSONAL FACTORS: Age are also affecting patient's functional outcome.   REHAB POTENTIAL: Good  CLINICAL DECISION MAKING: Stable/uncomplicated  EVALUATION COMPLEXITY: Low   GOALS: Goals reviewed with patient? Yes  SHORT TERM GOALS: Target date: 03/19/2022  Be independent in initial HEP Baseline: Goal status: INITIAL  2.  Verbalize and demonstrate body mechanics modifications for lumbar protection with ADLs and self-care Baseline:  Goal status: INITIAL  3.  Report > or = to 30% reduction in LBP with ADLs and self-care Baseline:  Goal status: INITIAL    LONG TERM GOALS: Target date: 04/16/2022  Be independent in advanced HEP Baseline:  Goal status: INITIAL  2.  Improve FOTO to > or = to  Baseline:  Goal status: INITIAL  3.  Perform 5x sit to stand in < or = to 13 seconds to improve balance  Baseline: 21 seconds  Goal status: INITIAL  4.  Return to lifting and carrying for household tasks without limitation due to LBP Baseline:  Goal status:  INITIAL  5.  Report > or = to 70% reduction in LBP with self-care and ADLs Baseline:  Goal status: INITIAL  6.  Perform bed mobility with min effort and report no pain with this  Baseline:  Goal status: INITIAL  PLAN:  PT FREQUENCY: 1-2x/week  PT DURATION: 8 weeks  PLANNED INTERVENTIONS: Therapeutic exercises, Therapeutic activity, Neuromuscular re-education, Balance training, Gait training, Patient/Family education, Self Care, Joint mobilization, Joint manipulation, Aquatic Therapy, Dry Needling, Electrical stimulation, Spinal manipulation, Spinal mobilization, Cryotherapy, Moist heat, Taping, Traction, Ultrasound, Manual therapy, and Re-evaluation.  PLAN FOR NEXT SESSION: review HEP, body mechanics education, work on bed mobility, balance tasks that incorporate core   Sigurd Sos, PT 03/04/22 1:20 PM   [facility] [address], [place], [place] [address] Phone # [phone] Fax [phone]

## 2022-03-06 ENCOUNTER — Ambulatory Visit: Payer: 59

## 2022-03-06 DIAGNOSIS — R293 Abnormal posture: Secondary | ICD-10-CM

## 2022-03-06 DIAGNOSIS — M6281 Muscle weakness (generalized): Secondary | ICD-10-CM

## 2022-03-06 DIAGNOSIS — M5459 Other low back pain: Secondary | ICD-10-CM | POA: Diagnosis not present

## 2022-03-06 DIAGNOSIS — R252 Cramp and spasm: Secondary | ICD-10-CM

## 2022-03-06 DIAGNOSIS — M546 Pain in thoracic spine: Secondary | ICD-10-CM

## 2022-03-06 NOTE — Therapy (Addendum)
OUTPATIENT PHYSICAL THERAPY TREATMENT   Patient Name: Donald [name] MRN: [id number] DOB:1955-06-23, 66 y.o., male Today's Date: 03/06/2022   PT End of Session - 03/06/22 1311     Visit Number 3    Date for PT Re-Evaluation 04/16/22    Authorization Type UHC    PT Start Time 1232    PT Stop Time 1311    PT Time Calculation (min) 39 min    Activity Tolerance Patient tolerated treatment well    Behavior During Therapy WFL for tasks assessed/performed               Past Medical History:  Diagnosis Date   Arthritis    Asthma    Hyperlipidemia    Hypertension    Sleep apnea    cpap settings ? 10    Past Surgical History:  Procedure Laterality Date   HERNIA REPAIR     x 2    SINUS EXPLORATION     TOTAL HIP ARTHROPLASTY Right 03/10/2014   Procedure: RIGHT TOTAL HIP ARTHROPLASTY ANTERIOR APPROACH;  Surgeon: Kathryne Hitch, MD;  Location: [facility];  Service: Orthopedics;  Laterality: Right;   TOTAL HIP ARTHROPLASTY Left 02/13/2017   Procedure: LEFT TOTAL HIP ARTHROPLASTY ANTERIOR APPROACH;  Surgeon: Kathryne Hitch, MD;  Location: [facility];  Service: Orthopedics;  Laterality: Left;   URETEROSCOPY     multiple times    Patient Active Problem List   Diagnosis Date Noted   Angioedema of lips 07/12/2017   Hypertension 07/12/2017   Hyponatremia 07/12/2017   Psoriasis    Osteoarthritis of left hip 02/13/2017   Status post total replacement of left hip 02/13/2017   Primary osteoarthritis of right hip 03/10/2014   Status post total replacement of right hip 03/10/2014   Diastasis recti 02/21/2013    PCP: Farris Has, MD  REFERRING PROVIDER: Jorge Ny, NP  REFERRING DIAG: M54.50 (ICD-10-CM) - Low back pain, unspecified  Rationale for Evaluation and Treatment Rehabilitation  THERAPY DIAG:  Other low back pain  Cramp and spasm  Abnormal posture  Muscle weakness (generalized)  Pain in thoracic spine  ONSET DATE: 01/2022  SUBJECTIVE:                                                                                                                                                                                            SUBJECTIVE STATEMENT: I am scheduled in [place] in 2 weeks.  I haven't had time to do my exercises.  I've been busy getting my house settled.    PERTINENT HISTORY:  Asthma, HTN, sleep apnea   PAIN:  Are you having  pain? Yes: NPRS scale: 3/10  Pain location: Low back  Pain description: sore, searing  Aggravating factors: getting out of bed, standing, lifting  Relieving factors: muscle relaxers   PRECAUTIONS: None  WEIGHT BEARING RESTRICTIONS: No  FALLS:  Has patient fallen in last 6 months? Yes. Number of falls many in the past year (PT will address)  LIVING ENVIRONMENT: Lives with: lives with their family Lives in: House/apartment Stairs: Yes: External: 2 steps; on right going up Has following equipment at home: None  OCCUPATION: retired   PLOF: Independent  PATIENT GOALS: reduce LBP/thoracic pain   OBJECTIVE:   DIAGNOSTIC FINDINGS:  X-ray: IMPRESSION: 1. No acute fracture. 2. Mild anterolisthesis at L4-L5. 3. Degenerative changes in the lower lumbar spine.  PATIENT SURVEYS:  FOTO 42 (60 is goal)  SCREENING FOR RED FLAGS: Bowel or bladder incontinence: No Spinal tumors: No Cauda equina syndrome: No Compression fracture: No Abdominal aneurysm: No  COGNITION: Overall cognitive status: Within functional limits for tasks assessed     SENSATION: WFL  MUSCLE LENGTH: Limited by 25%   POSTURE: rounded shoulders, forward head, and flexed trunk   PALPATION: No significant palpable tenderness today.   LUMBAR ROM:   AROM eval  Flexion   Extension   Right lateral flexion   Left lateral flexion   Right rotation   Left rotation    (Blank rows = not tested)  LOWER EXTREMITY ROM:    Limited by 25% bilaterally  LOWER EXTREMITY MMT:   Hip 4/5, knee 4+/5, bil shoulders  4+/5 Bed Mobility: max effort from pt and required moderate verbal cues for log roll  FUNCTIONAL TESTS:  5 times sit to stand: 21.29  GAIT: Distance walked: 100 Assistive device utilized: None Level of assistance: Modified independence Comments: slow mobility, flexed trunk Date: 03/06/22 NuStep: Level 3x 8 minutes-PT present to discuss progress Seated hamstring stretch 3x20 seconds  Sit to stand 2x10- verbal cues for controlled descent Open book stretch x10 each Supine ER with red theraband 2x10 Trunk rotation 3x20 seconds  Heel raises 2x10 Standing hip abduction bil 2x10- verbal cues for core activation and alignment  Date: 03/04/22 NuStep: Level 3x 8 minutes-PT present to discuss progress Seated hamstring stretch 3x20 seconds  Sit to stand 2x10- verbal cues for controlled descent Open book stretch x10 each Supine ER with red theraband 2x10 Trunk rotation 3x20 seconds  Heel raises 2x10 Standing hip abduction bil 2x10- verbal cues for core activation and alignment  TODAY'S TREATMENT:                                                                                                                              DATE:02/19/22  PATIENT EDUCATION:  Education details: Access Code: [email or web address] Person educated: Patient Education method: Explanation, Demonstration, and Handouts Education comprehension: verbalized understanding and returned demonstration  HOME EXERCISE PROGRAM: Access Code: [email or web address] URL: [email or web address] Date: 03/04/2022 Prepared by: Tresa Endo  Exercises - Supine Lower Trunk Rotation  - 3  x daily - 7 x weekly - 1 sets - 3 reps - 20 hold - Seated Hamstring Stretch  - 3 x daily - 7 x weekly - 1 sets - 3 reps - 20 hold - Sit to Stand Without Arm Support  - 2 x daily - 7 x weekly - 2 sets - 10 reps - Seated Scapular Retraction  - 5 x daily - 7 x weekly - 1 sets - 10 reps - Seated Upright Posture Correction  - 1 x daily - 7 x weekly - 1 sets - 10 reps -  Standing Heel Raise with Chair Support  - 2 x daily - 7 x weekly - 2 sets - 10 reps - Standing Hip Abduction with Counter Support  - 1 x daily - 7 x weekly - 2 sets - 10 reps - Supine Bilateral Shoulder External Rotation with Resistance  - 2 x daily - 7 x weekly - 2 sets - 10 reps  ASSESSMENT:  CLINICAL IMPRESSION: Pt reports that he has been busy getting unpacked so not a lot of time to do homework.  Pt arrived with 3/10 thoracic pain after driving from [facility].  Pt tolerated all exercises well without increased pain.  Bed mobility is improved with less pain today.  PT present to monitor for technique and alignment.  Patient will benefit from skilled PT to address the below impairments and improve overall function.   OBJECTIVE IMPAIRMENTS: decreased activity tolerance, decreased balance, difficulty walking, decreased strength, increased muscle spasms, impaired flexibility, improper body mechanics, postural dysfunction, and pain.   ACTIVITY LIMITATIONS: carrying, lifting, squatting, bed mobility, and locomotion level  PARTICIPATION LIMITATIONS: cleaning, laundry, and community activity  PERSONAL FACTORS: Age are also affecting patient's functional outcome.   REHAB POTENTIAL: Good  CLINICAL DECISION MAKING: Stable/uncomplicated  EVALUATION COMPLEXITY: Low   GOALS: Goals reviewed with patient? Yes  SHORT TERM GOALS: Target date: 03/19/2022  Be independent in initial HEP Baseline: Goal status: INITIAL  2.  Verbalize and demonstrate body mechanics modifications for lumbar protection with ADLs and self-care Baseline:  Goal status: INITIAL  3.  Report > or = to 30% reduction in LBP with ADLs and self-care Baseline:  Goal status: INITIAL    LONG TERM GOALS: Target date: 04/16/2022  Be independent in advanced HEP Baseline:  Goal status: INITIAL  2.  Improve FOTO to > or = to  Baseline:  Goal status: INITIAL  3.  Perform 5x sit to stand in < or = to 13 seconds to  improve balance  Baseline: 21 seconds  Goal status: INITIAL  4.  Return to lifting and carrying for household tasks without limitation due to LBP Baseline:  Goal status: INITIAL  5.  Report > or = to 70% reduction in LBP with self-care and ADLs Baseline:  Goal status: INITIAL  6.  Perform bed mobility with min effort and report no pain with this  Baseline:  Goal status: INITIAL  PLAN:  PT FREQUENCY: 1-2x/week  PT DURATION: 8 weeks  PLANNED INTERVENTIONS: Therapeutic exercises, Therapeutic activity, Neuromuscular re-education, Balance training, Gait training, Patient/Family education, Self Care, Joint mobilization, Joint manipulation, Aquatic Therapy, Dry Needling, Electrical stimulation, Spinal manipulation, Spinal mobilization, Cryotherapy, Moist heat, Taping, Traction, Ultrasound, Manual therapy, and Re-evaluation.  PLAN FOR NEXT SESSION: core and hip strength, postural strength, balance exercises   [name], PT 03/06/22 1:11 PM   [facility] [address], [address] [place], [place] [address] Phone # [phone] Fax [phone]

## 2022-03-11 ENCOUNTER — Ambulatory Visit: Payer: 59

## 2022-03-11 DIAGNOSIS — M6281 Muscle weakness (generalized): Secondary | ICD-10-CM

## 2022-03-11 DIAGNOSIS — M5459 Other low back pain: Secondary | ICD-10-CM

## 2022-03-11 DIAGNOSIS — M546 Pain in thoracic spine: Secondary | ICD-10-CM

## 2022-03-11 DIAGNOSIS — R293 Abnormal posture: Secondary | ICD-10-CM

## 2022-03-11 DIAGNOSIS — R252 Cramp and spasm: Secondary | ICD-10-CM

## 2022-03-11 NOTE — Therapy (Signed)
OUTPATIENT PHYSICAL THERAPY TREATMENT   Patient Name: Donald [name] MRN: [id number] DOB:04-30-1955, 66 y.o., male Today's Date: 03/11/2022   PT End of Session - 03/11/22 1313     Visit Number 4    Date for PT Re-Evaluation 04/16/22    Authorization Type [employer]    PT Start Time 1232    PT Stop Time 1311    PT Time Calculation (min) 39 min    Activity Tolerance Patient tolerated treatment well    Behavior During Therapy WFL for tasks assessed/performed                Past Medical History:  Diagnosis Date   Arthritis    Asthma    Hyperlipidemia    Hypertension    Sleep apnea    cpap settings ? 10    Past Surgical History:  Procedure Laterality Date   HERNIA REPAIR     x 2    SINUS EXPLORATION     TOTAL HIP ARTHROPLASTY Right 03/10/2014   Procedure: RIGHT TOTAL HIP ARTHROPLASTY ANTERIOR APPROACH;  Surgeon: Mcarthur Rossetti, MD;  Location: [facility];  Service: Orthopedics;  Laterality: Right;   TOTAL HIP ARTHROPLASTY Left 02/13/2017   Procedure: LEFT TOTAL HIP ARTHROPLASTY ANTERIOR APPROACH;  Surgeon: Mcarthur Rossetti, MD;  Location: [facility];  Service: Orthopedics;  Laterality: Left;   URETEROSCOPY     multiple times    Patient Active Problem List   Diagnosis Date Noted   Angioedema of lips 07/12/2017   Hypertension 07/12/2017   Hyponatremia 07/12/2017   Psoriasis    Osteoarthritis of left hip 02/13/2017   Status post total replacement of left hip 02/13/2017   Primary osteoarthritis of right hip 03/10/2014   Status post total replacement of right hip 03/10/2014   Diastasis recti 02/21/2013    PCP: London Pepper, MD  REFERRING PROVIDER: Windy Carina, NP  REFERRING DIAG: M54.50 (ICD-10-CM) - Low back pain, unspecified  Rationale for Evaluation and Treatment Rehabilitation  THERAPY DIAG:  Other low back pain  Cramp and spasm  Abnormal posture  Muscle weakness (generalized)  Pain in thoracic spine  ONSET DATE: 01/2022  SUBJECTIVE:                                                                                                                                                                                            SUBJECTIVE STATEMENT: I haven't been doing my exercises due to getting settled at my new house.  I am >50% better.  I had pain at the end of the day yesterday in my upper back on the Lt.  PERTINENT HISTORY:  Asthma, HTN, sleep apnea   PAIN:  Are you having pain? Yes: NPRS scale: 2/10, up to 6/10  Pain location: Low back  Pain description: sore, searing  Aggravating factors: getting out of bed, standing, lifting  Relieving factors: muscle relaxers   PRECAUTIONS: None  WEIGHT BEARING RESTRICTIONS: No  FALLS:  Has patient fallen in last 6 months? Yes. Number of falls many in the past year 1 (PT will address)  LIVING ENVIRONMENT: Lives with: lives with their family Lives in: House/apartment Stairs: Yes: External: 2 steps; on right going up Has following equipment at home: None  OCCUPATION: retired   PLOF: Independent  PATIENT GOALS: reduce LBP/thoracic pain   OBJECTIVE:   DIAGNOSTIC FINDINGS:  X-ray: IMPRESSION: 1. No acute fracture. 2. Mild anterolisthesis at L4-L5. 3. Degenerative changes in the lower lumbar spine.  PATIENT SURVEYS:  FOTO 24 (10 is goal)  SCREENING FOR RED FLAGS: Bowel or bladder incontinence: No Spinal tumors: No Cauda equina syndrome: No Compression fracture: No Abdominal aneurysm: No  COGNITION: Overall cognitive status: Within functional limits for tasks assessed     SENSATION: WFL  MUSCLE LENGTH: Limited by 25%   POSTURE: rounded shoulders, forward head, and flexed trunk   PALPATION: No significant palpable tenderness today.   LOWER EXTREMITY ROM:    Limited by 25% bilaterally  LOWER EXTREMITY MMT:   Hip 4/5, knee 4+/5, bil shoulders 4+/5 Bed Mobility: max effort from pt and required moderate verbal cues for log roll  FUNCTIONAL TESTS:  5  times sit to stand: 21.29  GAIT: Distance walked: 100 Assistive device utilized: None Level of assistance: Modified independence Comments: slow mobility, flexed trunk  Date: 03/11/22 NuStep: Level 3x 8 minutes-PT present to discuss progress Seated 3 way raises (flexion/scaption/abduction) 2x10 Seated hamstring stretch 3x20 seconds  Sit to stand 2x10- verbal cues for controlled descent Open book stretch x10 each Supine ER and horizontal abductionwith red theraband 2x10 Trunk rotation 3x20 seconds  Heel raises 2x10 Standing hip abduction bil 2x10- verbal cues for core activation and alignment  Date: 03/06/22 NuStep: Level 3x 8 minutes-PT present to discuss progress Seated hamstring stretch 3x20 seconds  Sit to stand 2x10- verbal cues for controlled descent Open book stretch x10 each Supine ER with red theraband 2x10 Trunk rotation 3x20 seconds  Heel raises 2x10 Standing hip abduction bil 2x10- verbal cues for core activation and alignment  Date: 03/04/22 NuStep: Level 3x 8 minutes-PT present to discuss progress Seated hamstring stretch 3x20 seconds  Sit to stand 2x10- verbal cues for controlled descent Open book stretch x10 each Supine ER with red theraband 2x10 Trunk rotation 3x20 seconds  Heel raises 2x10 Standing hip abduction bil 2x10- verbal cues for core activation and alignment   PATIENT EDUCATION:  Education details: Access Code: [email or web address] Person educated: Patient Education method: Explanation, Demonstration, and Handouts Education comprehension: verbalized understanding and returned demonstration  HOME EXERCISE PROGRAM: Access Code: [email or web address] URL: [email or web address] Date: 03/04/2022 Prepared by: Claiborne Billings  Exercises - Supine Lower Trunk Rotation  - 3 x daily - 7 x weekly - 1 sets - 3 reps - 20 hold - Seated Hamstring Stretch  - 3 x daily - 7 x weekly - 1 sets - 3 reps - 20 hold - Sit to Stand Without Arm Support  - 2 x daily - 7 x weekly - 2 sets  - 10 reps - Seated Scapular Retraction  - 5 x daily - 7 x weekly - 1 sets - 10 reps - Seated  Upright Posture Correction  - 1 x daily - 7 x weekly - 1 sets - 10 reps - Standing Heel Raise with Chair Support  - 2 x daily - 7 x weekly - 2 sets - 10 reps - Standing Hip Abduction with Counter Support  - 1 x daily - 7 x weekly - 2 sets - 10 reps - Supine Bilateral Shoulder External Rotation with Resistance  - 2 x daily - 7 x weekly - 2 sets - 10 reps  ASSESSMENT:  CLINICAL IMPRESSION: Pt reports 50% overall improvement in symptoms since the start of care.  Pt has experienced pain less frequently and reports 6/10 thoracic pain at the end of the day yesterday.  Pt tolerated all exercises well without increased pain.  Bed mobility is improved overall with reduced use of upper extremities.  Pt with rounded shoulder posture and requires verbal and demo cues for alignment.   PT present to monitor for technique and alignment.  Patient will benefit from skilled PT to address the below impairments and improve overall function.   OBJECTIVE IMPAIRMENTS: decreased activity tolerance, decreased balance, difficulty walking, decreased strength, increased muscle spasms, impaired flexibility, improper body mechanics, postural dysfunction, and pain.   ACTIVITY LIMITATIONS: carrying, lifting, squatting, bed mobility, and locomotion level  PARTICIPATION LIMITATIONS: cleaning, laundry, and community activity  PERSONAL FACTORS: Age are also affecting patient's functional outcome.   REHAB POTENTIAL: Good  CLINICAL DECISION MAKING: Stable/uncomplicated  EVALUATION COMPLEXITY: Low   GOALS: Goals reviewed with patient? Yes  SHORT TERM GOALS: Target date: 03/19/2022  Be independent in initial HEP Baseline: Goal status: MET  2.  Verbalize and demonstrate body mechanics modifications for lumbar protection with ADLs and self-care Baseline:  Goal status: INITIAL  3.  Report > or = to 30% reduction in LBP with  ADLs and self-care Baseline: >50% (03/11/22) Goal status: MET    LONG TERM GOALS: Target date: 04/16/2022  Be independent in advanced HEP Baseline:  Goal status: INITIAL  2.  Improve FOTO to > or = to 60 Baseline:  Goal status: INITIAL  3.  Perform 5x sit to stand in < or = to 13 seconds to improve balance  Baseline: 21 seconds  Goal status: INITIAL  4.  Return to lifting and carrying for household tasks without limitation due to LBP Baseline:  Goal status: INITIAL  5.  Report > or = to 70% reduction in LBP with self-care and ADLs Baseline:  Goal status: INITIAL  6.  Perform bed mobility with min effort and report no pain with this  Baseline:  Goal status: INITIAL  PLAN:  PT FREQUENCY: 1-2x/week  PT DURATION: 8 weeks  PLANNED INTERVENTIONS: Therapeutic exercises, Therapeutic activity, Neuromuscular re-education, Balance training, Gait training, Patient/Family education, Self Care, Joint mobilization, Joint manipulation, Aquatic Therapy, Dry Needling, Electrical stimulation, Spinal manipulation, Spinal mobilization, Cryotherapy, Moist heat, Taping, Traction, Ultrasound, Manual therapy, and Re-evaluation.  PLAN FOR NEXT SESSION: core and hip strength, postural strength, balance exercises.  Pt will transfer to [place] since this is closer to his new house in [place].  Sigurd Sos, PT 03/11/22 1:14 PM   [facility] [address], [place], [place] [address] Phone # [phone] Fax [phone]

## 2022-03-12 NOTE — Therapy (Signed)
OUTPATIENT PHYSICAL THERAPY TREATMENT   Patient Name: Donald [name] MRN: [id number] DOB:[birth year], 66 y.o., male Today's Date: 03/11/2022   PT End of Session - 03/11/22 1313     Visit Number 4    Date for PT Re-Evaluation 04/16/22    Authorization Type [employer]    PT Start Time 1232    PT Stop Time 1311    PT Time Calculation (min) 39 min    Activity Tolerance Patient tolerated treatment well    Behavior During Therapy WFL for tasks assessed/performed                Past Medical History:  Diagnosis Date   Arthritis    Asthma    Hyperlipidemia    Hypertension    Sleep apnea    cpap settings ? 10    Past Surgical History:  Procedure Laterality Date   HERNIA REPAIR     x 2    SINUS EXPLORATION     TOTAL HIP ARTHROPLASTY Right 03/10/2014   Procedure: RIGHT TOTAL HIP ARTHROPLASTY ANTERIOR APPROACH;  Surgeon: Mcarthur Rossetti, MD;  Location: [facility];  Service: Orthopedics;  Laterality: Right;   TOTAL HIP ARTHROPLASTY Left 02/13/2017   Procedure: LEFT TOTAL HIP ARTHROPLASTY ANTERIOR APPROACH;  Surgeon: Mcarthur Rossetti, MD;  Location: [facility];  Service: Orthopedics;  Laterality: Left;   URETEROSCOPY     multiple times    Patient Active Problem List   Diagnosis Date Noted   Angioedema of lips 07/12/2017   Hypertension 07/12/2017   Hyponatremia 07/12/2017   Psoriasis    Osteoarthritis of left hip 02/13/2017   Status post total replacement of left hip 02/13/2017   Primary osteoarthritis of right hip 03/10/2014   Status post total replacement of right hip 03/10/2014   Diastasis recti 02/21/2013    PCP: London Pepper, MD  REFERRING PROVIDER: Windy Carina, NP  REFERRING DIAG: M54.50 (ICD-10-CM) - Low back pain, unspecified  Rationale for Evaluation and Treatment Rehabilitation  THERAPY DIAG:  Other low back pain  Cramp and spasm  Abnormal posture  Muscle weakness (generalized)  Pain in thoracic spine  ONSET DATE: 01/2022  SUBJECTIVE:                                                                                                                                                                                            SUBJECTIVE STATEMENT: My pain is only at the end of the day now.      PERTINENT HISTORY:  Asthma, HTN, sleep apnea   PAIN:  Are you having pain? Yes: NPRS scale: 2/10, up to 6/10  Pain location:  Low back  Pain description: sore, searing  Aggravating factors: getting out of bed, standing, lifting  Relieving factors: muscle relaxers   PRECAUTIONS: None  WEIGHT BEARING RESTRICTIONS: No  FALLS:  Has patient fallen in last 6 months? Yes. Number of falls many in the past year 1 (PT will address)  LIVING ENVIRONMENT: Lives with: lives with their family Lives in: House/apartment Stairs: Yes: External: 2 steps; on right going up Has following equipment at home: None  OCCUPATION: retired   PLOF: Independent  PATIENT GOALS: reduce LBP/thoracic pain   OBJECTIVE:   DIAGNOSTIC FINDINGS:  X-ray: IMPRESSION: 1. No acute fracture. 2. Mild anterolisthesis at L4-L5. 3. Degenerative changes in the lower lumbar spine.  PATIENT SURVEYS:  FOTO 38 (52 is goal)  SCREENING FOR RED FLAGS: Bowel or bladder incontinence: No Spinal tumors: No Cauda equina syndrome: No Compression fracture: No Abdominal aneurysm: No  COGNITION: Overall cognitive status: Within functional limits for tasks assessed     SENSATION: WFL  MUSCLE LENGTH: Limited by 25%   POSTURE: rounded shoulders, forward head, and flexed trunk   PALPATION: No significant palpable tenderness today.   LOWER EXTREMITY ROM:    Limited by 25% bilaterally  LOWER EXTREMITY MMT:   Hip 4/5, knee 4+/5, bil shoulders 4+/5 Bed Mobility: max effort from pt and required moderate verbal cues for log roll  FUNCTIONAL TESTS:  5 times sit to stand: 21.29  GAIT: Distance walked: 100 Assistive device utilized: None Level of assistance: Modified  independence Comments: slow mobility, flexed trunk  Date: 03/13/22 NuStep: Level 3x 6 minutes (increase resistance) Sit to stand x 10 Functional squat 2x5 cues for correct form Standing hip ABD RTB 2x10 ea  Posture and body mechanics reviewed: Squat with 17# box to mat table x 10 Deadlifts x 10 to stool, to ground x 10, then with 10# KB x 5    Date: 03/11/22 NuStep: Level 3x 8 minutes-PT present to discuss progress Seated 3 way raises (flexion/scaption/abduction) 2x10 Seated hamstring stretch 3x20 seconds  Sit to stand 2x10- verbal cues for controlled descent Open book stretch x10 each Supine ER and horizontal abductionwith red theraband 2x10 Trunk rotation 3x20 seconds  Heel raises 2x10 Standing hip abduction bil 2x10- verbal cues for core activation and alignment  Date: 03/06/22 NuStep: Level 3x 8 minutes-PT present to discuss progress Seated hamstring stretch 3x20 seconds  Sit to stand 2x10- verbal cues for controlled descent Open book stretch x10 each Supine ER with red theraband 2x10 Trunk rotation 3x20 seconds  Heel raises 2x10 Standing hip abduction bil 2x10- verbal cues for core activation and alignment   PATIENT EDUCATION:  Education details: posture and body mechanics for typical daily postioning, mobility and household tasks' Person educated: Patient Education method: Explanation, Media planner, and Handouts Education comprehension: verbalized understanding and returned demonstration  HOME EXERCISE PROGRAM: Access Code: [email or web address] URL: [email or web address] Date: 03/04/2022 Prepared by: Claiborne Billings  Exercises - Supine Lower Trunk Rotation  - 3 x daily - 7 x weekly - 1 sets - 3 reps - 20 hold - Seated Hamstring Stretch  - 3 x daily - 7 x weekly - 1 sets - 3 reps - 20 hold - Sit to Stand Without Arm Support  - 2 x daily - 7 x weekly - 2 sets - 10 reps - Seated Scapular Retraction  - 5 x daily - 7 x weekly - 1 sets - 10 reps - Seated Upright Posture  Correction  - 1 x daily - 7 x weekly - 1  sets - 10 reps - Standing Heel Raise with Chair Support  - 2 x daily - 7 x weekly - 2 sets - 10 reps - Standing Hip Abduction with Counter Support  - 1 x daily - 7 x weekly - 2 sets - 10 reps - Supine Bilateral Shoulder External Rotation with Resistance  - 2 x daily - 7 x weekly - 2 sets - 10 reps  ASSESSMENT:  CLINICAL IMPRESSION: Dayyan denies any LBP now which he attributes to meds and wasn't sure he needed to continue PT. He is non-compliant with HEP due to being in the process of moving. We went over ADL modifications and body mechanics today to prevent re-occurrence of back pain. Significant cueing needed for form with dead lifts. He would benefit from ongoing LE strengthening to ensure good mechanics. He also reports that his balance is poor and that he falls occassionally. He would benefit from more focus on glute med strength, functional activities like floor to stand transfers, squats/lunges and balance activities.   OBJECTIVE IMPAIRMENTS: decreased activity tolerance, decreased balance, difficulty walking, decreased strength, increased muscle spasms, impaired flexibility, improper body mechanics, postural dysfunction, and pain.   ACTIVITY LIMITATIONS: carrying, lifting, squatting, bed mobility, and locomotion level  PARTICIPATION LIMITATIONS: cleaning, laundry, and community activity  PERSONAL FACTORS: Age are also affecting patient's functional outcome.   REHAB POTENTIAL: Good  CLINICAL DECISION MAKING: Stable/uncomplicated  EVALUATION COMPLEXITY: Low   GOALS: Goals reviewed with patient? Yes  SHORT TERM GOALS: Target date: 03/19/2022  Be independent in initial HEP Baseline: Goal status: MET  2.  Verbalize and demonstrate body mechanics modifications for lumbar protection with ADLs and self-care Baseline:  Goal status: INITIAL  3.  Report > or = to 30% reduction in LBP with ADLs and self-care Baseline: >50% (03/11/22) Goal  status: MET    LONG TERM GOALS: Target date: 04/16/2022  Be independent in advanced HEP Baseline:  Goal status: INITIAL  2.  Improve FOTO to > or = to 60 Baseline:  Goal status: INITIAL  3.  Perform 5x sit to stand in < or = to 13 seconds to improve balance  Baseline: 21 seconds, 18.33 sec 03/13/22 Goal status: IN PROGRESS  4.  Return to lifting and carrying for household tasks without limitation due to LBP Baseline:  Goal status: INITIAL  5.  Report > or = to 70% reduction in LBP with self-care and ADLs Baseline:  Goal status: INITIAL  6.  Perform bed mobility with min effort and report no pain with this  Baseline:  Goal status: MET  PLAN:  PT FREQUENCY: 1-2x/week  PT DURATION: 8 weeks  PLANNED INTERVENTIONS: Therapeutic exercises, Therapeutic activity, Neuromuscular re-education, Balance training, Gait training, Patient/Family education, Self Care, Joint mobilization, Joint manipulation, Aquatic Therapy, Dry Needling, Electrical stimulation, Spinal manipulation, Spinal mobilization, Cryotherapy, Moist heat, Taping, Traction, Ultrasound, Manual therapy, and Re-evaluation.  PLAN FOR NEXT SESSION: Focus on  glute med strength, functional activities like floor to stand transfers, squats/lunges and balance activities.   Madelyn Flavors, PT 03/13/22 3:02 PM

## 2022-03-13 ENCOUNTER — Encounter: Payer: Self-pay | Admitting: Physical Therapy

## 2022-03-13 ENCOUNTER — Ambulatory Visit: Payer: 59 | Admitting: Physical Therapy

## 2022-03-13 DIAGNOSIS — M546 Pain in thoracic spine: Secondary | ICD-10-CM

## 2022-03-13 DIAGNOSIS — M5459 Other low back pain: Secondary | ICD-10-CM

## 2022-03-13 DIAGNOSIS — R293 Abnormal posture: Secondary | ICD-10-CM

## 2022-03-13 DIAGNOSIS — M6281 Muscle weakness (generalized): Secondary | ICD-10-CM

## 2022-03-13 DIAGNOSIS — R252 Cramp and spasm: Secondary | ICD-10-CM

## 2022-03-18 ENCOUNTER — Encounter: Payer: Self-pay | Admitting: Rehabilitative and Restorative Service Providers"

## 2022-03-18 ENCOUNTER — Ambulatory Visit: Payer: 59 | Admitting: Rehabilitative and Restorative Service Providers"

## 2022-03-18 DIAGNOSIS — R252 Cramp and spasm: Secondary | ICD-10-CM

## 2022-03-18 DIAGNOSIS — R293 Abnormal posture: Secondary | ICD-10-CM

## 2022-03-18 DIAGNOSIS — M546 Pain in thoracic spine: Secondary | ICD-10-CM

## 2022-03-18 DIAGNOSIS — M6281 Muscle weakness (generalized): Secondary | ICD-10-CM

## 2022-03-18 DIAGNOSIS — M5459 Other low back pain: Secondary | ICD-10-CM

## 2022-03-18 NOTE — Therapy (Signed)
OUTPATIENT PHYSICAL THERAPY TREATMENT   Patient Name: Donald [name] MRN: [id number] DOB:January 04, 1956, 66 y.o., male Today's Date: 03/18/2022   PT End of Session - 03/18/22 1315     Visit Number 6    Date for PT Re-Evaluation 04/16/22    Authorization Type [employer]    PT Start Time 1315    PT Stop Time 1400    PT Time Calculation (min) 45 min    Activity Tolerance Patient tolerated treatment well                Past Medical History:  Diagnosis Date   Arthritis    Asthma    Hyperlipidemia    Hypertension    Sleep apnea    cpap settings ? 10    Past Surgical History:  Procedure Laterality Date   HERNIA REPAIR     x 2    SINUS EXPLORATION     TOTAL HIP ARTHROPLASTY Right 03/10/2014   Procedure: RIGHT TOTAL HIP ARTHROPLASTY ANTERIOR APPROACH;  Surgeon: Mcarthur Rossetti, MD;  Location: [facility];  Service: Orthopedics;  Laterality: Right;   TOTAL HIP ARTHROPLASTY Left 02/13/2017   Procedure: LEFT TOTAL HIP ARTHROPLASTY ANTERIOR APPROACH;  Surgeon: Mcarthur Rossetti, MD;  Location: [facility];  Service: Orthopedics;  Laterality: Left;   URETEROSCOPY     multiple times    Patient Active Problem List   Diagnosis Date Noted   Angioedema of lips 07/12/2017   Hypertension 07/12/2017   Hyponatremia 07/12/2017   Psoriasis    Osteoarthritis of left hip 02/13/2017   Status post total replacement of left hip 02/13/2017   Primary osteoarthritis of right hip 03/10/2014   Status post total replacement of right hip 03/10/2014   Diastasis recti 02/21/2013    PCP: London Pepper, MD  REFERRING PROVIDER: Windy Carina, NP  REFERRING DIAG: M54.50 (ICD-10-CM) - Low back pain, unspecified  Rationale for Evaluation and Treatment Rehabilitation  THERAPY DIAG:  Other low back pain  Cramp and spasm  Abnormal posture  Muscle weakness (generalized)  Pain in thoracic spine  ONSET DATE: 01/2022  SUBJECTIVE:                                                                                                                                                                                            SUBJECTIVE STATEMENT: Back is doing well. No pain. Wants to work on balance. Tired of falling.       PERTINENT HISTORY:  Asthma, HTN, sleep apnea   PAIN:  Are you having pain? Yes: NPRS scale: 010 Pain location: Low back  Pain description: sore, searing  Aggravating factors:  getting out of bed, standing, lifting  Relieving factors: muscle relaxers   PRECAUTIONS: None  WEIGHT BEARING RESTRICTIONS: No  FALLS:  Has patient fallen in last 6 months? Yes. Number of falls many in the past year 1 (PT will address)  LIVING ENVIRONMENT: Lives with: lives with their family Lives in: House/apartment Stairs: Yes: External: 2 steps; on right going up Has following equipment at home: None  OCCUPATION: retired   PATIENT GOALS: reduce LBP/thoracic pain   OBJECTIVE:   DIAGNOSTIC FINDINGS:  X-ray: IMPRESSION: 1. No acute fracture. 2. Mild anterolisthesis at L4-L5. 3. Degenerative changes in the lower lumbar spine.  PATIENT SURVEYS:  FOTO 42 (47 is goal)  COGNITION: Overall cognitive status: Within functional limits for tasks assessed     SENSATION: WFL  MUSCLE LENGTH: Limited by 25%   POSTURE: rounded shoulders, forward head, and flexed trunk   PALPATION: No significant palpable tenderness today.   LOWER EXTREMITY ROM:    Limited by 25% bilaterally  LOWER EXTREMITY MMT:   Hip 4/5, knee 4+/5, bil shoulders 4+/5 Bed Mobility: max effort from pt and required moderate verbal cues for log roll  FUNCTIONAL TESTS:  5 times sit to stand: 21.29  GAIT: Distance walked: 100 Assistive device utilized: None Level of assistance: Modified independence Comments: slow mobility, flexed trunk  Date: 03/18/22:  NuStep: Level 6 x 6 minutes Sit to stand x 10 more narrow base, UE's over chest Hip abduction green TB leading with heel 10 x 2 sets each LE  Side  steps at counter green TB above knees 10 ft x 5 reps each direction Standing UE support at counter cabinet - fwd/side/back tap switching LE's x 10  Single leg stance 20 sec x 5 each side  Standing at counter reaching up x 10; reaching to side x 10; forward punch x 10 each UE. Standing one foot forward, narrow base UE support as needed x 3 each side  ~ 20 sec  Functional squat x 10 at counter UE's fwd Step up 6 in step bilat UE support x 10 each side    Posture and body mechanics reviewed: Squat with 17# box to mat table x 10 Deadlifts x 10 to stool, to ground x 10, then with 10# KB x 5  Date: 03/13/22 NuStep: Level 3x 6 minutes (increase resistance) Sit to stand x 10 Functional squat 2x5 cues for correct form Standing hip ABD RTB 2x10 ea  Posture and body mechanics reviewed: Squat with 17# box to mat table x 10 Deadlifts x 10 to stool, to ground x 10, then with 10# KB x 5    PATIENT EDUCATION:  Education details: posture and body mechanics for typical daily postioning, mobility and household tasks' Person educated: Patient Education method: Consulting civil engineer, Media planner, and Handouts Education comprehension: verbalized understanding and returned demonstration  HOME EXERCISE PROGRAM:  Access Code: [email or web address] URL: [email or web address] Date: 03/18/2022 Prepared by: Gillermo Murdoch  Exercises - Supine Lower Trunk Rotation  - 3 x daily - 7 x weekly - 1 sets - 3 reps - 20 hold - Seated Hamstring Stretch  - 3 x daily - 7 x weekly - 1 sets - 3 reps - 20 hold - Sit to Stand Without Arm Support  - 2 x daily - 7 x weekly - 2 sets - 10 reps - Seated Scapular Retraction  - 5 x daily - 7 x weekly - 1 sets - 10 reps - Seated Upright Posture Correction  - 1 x daily - 7 x  weekly - 1 sets - 10 reps - Standing Heel Raise with Chair Support  - 2 x daily - 7 x weekly - 2 sets - 10 reps - Standing Hip Abduction with Counter Support  - 1 x daily - 7 x weekly - 2 sets - 10 reps - Supine  Bilateral Shoulder External Rotation with Resistance  - 2 x daily - 7 x weekly - 2 sets - 10 reps - Side Stepping with Resistance at Thighs  - 1 x daily - 7 x weekly - Side Stepping with Resistance at Thighs  - 1 x daily - 7 x weekly - Single Leg Stance  - 2 x daily - 7 x weekly - 2 sets - 5 reps - 20 sec hold - Single Leg Balance with Clock Reach  - 1 x daily - 7 x weekly - 1-2 sets - 10 reps - Mini Squat with Counter Support  - 2 x daily - 7 x weekly - 1-2 sets - 10 reps - 3 sec  hold ASSESSMENT:  CLINICAL IMPRESSION: 03/18/22: Orpah Greek reports full resolution of LBP. He would like to work on balance to avoid further falls. Progressed with strengthening for core and LE's. Added balance activities. He will benefit from ongoing LE strengthening to ensure good mechanics; focus on glute med strength, functional activities like floor to stand transfers, squats/lunges and balance activities.   OBJECTIVE IMPAIRMENTS: decreased activity tolerance, decreased balance, difficulty walking, decreased strength, increased muscle spasms, impaired flexibility, improper body mechanics, postural dysfunction, and pain.   ACTIVITY LIMITATIONS: carrying, lifting, squatting, bed mobility, and locomotion level  PARTICIPATION LIMITATIONS: cleaning, laundry, and community activity  PERSONAL FACTORS: Age are also affecting patient's functional outcome.   REHAB POTENTIAL: Good  CLINICAL DECISION MAKING: Stable/uncomplicated  EVALUATION COMPLEXITY: Low   GOALS: Goals reviewed with patient? Yes  SHORT TERM GOALS: Target date: 03/19/2022  Be independent in initial HEP Baseline: Goal status: MET  2.  Verbalize and demonstrate body mechanics modifications for lumbar protection with ADLs and self-care Baseline:  Goal status: INITIAL  3.  Report > or = to 30% reduction in LBP with ADLs and self-care Baseline: >50% (03/11/22) Goal status: MET    LONG TERM GOALS: Target date: 04/16/2022  Be independent in  advanced HEP Baseline:  Goal status: INITIAL  2.  Improve FOTO to > or = to 60 Baseline:  Goal status: INITIAL  3.  Perform 5x sit to stand in < or = to 13 seconds to improve balance  Baseline: 21 seconds, 18.33 sec 03/13/22 Goal status: IN PROGRESS  4.  Return to lifting and carrying for household tasks without limitation due to LBP Baseline:  Goal status: INITIAL  5.  Report > or = to 70% reduction in LBP with self-care and ADLs Baseline:  Goal status: INITIAL  6.  Perform bed mobility with min effort and report no pain with this  Baseline:  Goal status: MET  PLAN:  PT FREQUENCY: 1-2x/week  PT DURATION: 8 weeks  PLANNED INTERVENTIONS: Therapeutic exercises, Therapeutic activity, Neuromuscular re-education, Balance training, Gait training, Patient/Family education, Self Care, Joint mobilization, Joint manipulation, Aquatic Therapy, Dry Needling, Electrical stimulation, Spinal manipulation, Spinal mobilization, Cryotherapy, Moist heat, Taping, Traction, Ultrasound, Manual therapy, and Re-evaluation.  PLAN FOR NEXT SESSION: Focus on  glute med strength, functional activities like floor to stand transfers, squats/lunges and balance activities.   Dmya Long P. Helene Kelp PT, MPH 03/18/22 2:00 PM

## 2022-03-25 ENCOUNTER — Encounter: Payer: 59 | Admitting: Rehabilitative and Restorative Service Providers"

## 2022-03-26 NOTE — Therapy (Signed)
OUTPATIENT PHYSICAL THERAPY TREATMENT   Patient Name: Donald [name] MRN: [id number] DOB:[birth year], 66 y.o., male Today's Date: 03/27/2022   PT End of Session - 03/27/22 1319     Visit Number 7    Date for PT Re-Evaluation 04/16/22    Authorization Type [employer]    PT Start Time 1319    PT Stop Time 1401    PT Time Calculation (min) 42 min    Activity Tolerance Patient tolerated treatment well    Behavior During Therapy WFL for tasks assessed/performed                Past Medical History:  Diagnosis Date   Arthritis    Asthma    Hyperlipidemia    Hypertension    Sleep apnea    cpap settings ? 10    Past Surgical History:  Procedure Laterality Date   HERNIA REPAIR     x 2    SINUS EXPLORATION     TOTAL HIP ARTHROPLASTY Right 03/10/2014   Procedure: RIGHT TOTAL HIP ARTHROPLASTY ANTERIOR APPROACH;  Surgeon: Mcarthur Rossetti, MD;  Location: [facility];  Service: Orthopedics;  Laterality: Right;   TOTAL HIP ARTHROPLASTY Left 02/13/2017   Procedure: LEFT TOTAL HIP ARTHROPLASTY ANTERIOR APPROACH;  Surgeon: Mcarthur Rossetti, MD;  Location: [facility];  Service: Orthopedics;  Laterality: Left;   URETEROSCOPY     multiple times    Patient Active Problem List   Diagnosis Date Noted   Angioedema of lips 07/12/2017   Hypertension 07/12/2017   Hyponatremia 07/12/2017   Psoriasis    Osteoarthritis of left hip 02/13/2017   Status post total replacement of left hip 02/13/2017   Primary osteoarthritis of right hip 03/10/2014   Status post total replacement of right hip 03/10/2014   Diastasis recti 02/21/2013    PCP: London Pepper, MD  REFERRING PROVIDER: Windy Carina, NP  REFERRING DIAG: M54.50 (ICD-10-CM) - Low back pain, unspecified  Rationale for Evaluation and Treatment Rehabilitation  THERAPY DIAG:  Other low back pain  Cramp and spasm  Abnormal posture  Muscle weakness (generalized)  Pain in thoracic spine  ONSET DATE: 01/2022  SUBJECTIVE:                                                                                                                                                                                            SUBJECTIVE STATEMENT: Patient reports he fell right before the weekend. Drank too much. Hurt his left ribs. He feels better now.      PERTINENT HISTORY:  Asthma, HTN, sleep apnea   PAIN:  Are you having pain? Yes: NPRS  scale: 010 Pain location: Low back  Pain description: sore, searing  Aggravating factors: getting out of bed, standing, lifting  Relieving factors: muscle relaxers   PRECAUTIONS: None  WEIGHT BEARING RESTRICTIONS: No  FALLS:  Has patient fallen in last 6 months? Yes. Number of falls many in the past year 1 (PT will address)  LIVING ENVIRONMENT: Lives with: lives with their family Lives in: House/apartment Stairs: Yes: External: 2 steps; on right going up Has following equipment at home: None  OCCUPATION: retired   PATIENT GOALS: reduce LBP/thoracic pain   OBJECTIVE:   DIAGNOSTIC FINDINGS:  X-ray: IMPRESSION: 1. No acute fracture. 2. Mild anterolisthesis at L4-L5. 3. Degenerative changes in the lower lumbar spine.  PATIENT SURVEYS:  FOTO 42 (60 is goal)   MUSCLE LENGTH: Limited by 25%   LOWER EXTREMITY ROM:    Limited by 25% bilaterally  LOWER EXTREMITY MMT:   Hip 4/5, knee 4+/5, bil shoulders 4+/5 Bed Mobility: max effort from pt and required moderate verbal cues for log roll  FUNCTIONAL TESTS:  5 times sit to stand: 21.29  GAIT: Distance walked: 100 Assistive device utilized: None Level of assistance: Modified independence Comments: slow mobility, flexed trunk  Date: 03/27/22:  Sit to stand x 10 more narrow base, UE's over chest Hip abduction green TB leading with heel 10 x 2 sets each LE  Side steps at counter green TB above knees 10 ft x 5 reps each direction Standing one foot forward, narrow base UE support as needed x 5 each side  ~ 20 sec  Narrow  BOS; then with vertical head movements; head turns x 5 ea Narrow BOS eyes closed 2 rounds 20 sec  Toe taps 4 in step x 5 ea each side with mat table behind, no UE support   Date: 03/18/22:  NuStep: Level 6 x 6 minutes Sit to stand x 10 more narrow base, UE's over chest Hip abduction green TB leading with heel 10 x 2 sets each LE  Side steps at counter green TB above knees 10 ft x 5 reps each direction Standing UE support at counter cabinet - fwd/side/back tap switching LE's x 10  Single leg stance 20 sec x 5 each side  Standing at counter reaching up x 10; reaching to side x 10; forward punch x 10 each UE. Standing one foot forward, narrow base UE support as needed x 3 each side  ~ 20 sec  Functional squat x 10 at counter UE's fwd Step up 6 in step bilat UE support x 10 each side    Posture and body mechanics reviewed: Squat with 17# box to mat table x 10 Deadlifts x 10 to stool, to ground x 10, then with 10# KB x 5  Date: 03/13/22 NuStep: Level 3x 6 minutes (increase resistance) Sit to stand x 10 Functional squat 2x5 cues for correct form Standing hip ABD RTB 2x10 ea  Posture and body mechanics reviewed: Squat with 17# box to mat table x 10 Deadlifts x 10 to stool, to ground x 10, then with 10# KB x 5    PATIENT EDUCATION:  Education details: posture and body mechanics for typical daily postioning, mobility and household tasks' Person educated: Patient Education method: Consulting civil engineer, Media planner, and Handouts Education comprehension: verbalized understanding and returned demonstration  HOME EXERCISE PROGRAM:  Access Code: [email or web address] URL: [email or web address] Date: 03/18/2022 Prepared by: Gillermo Murdoch  Exercises - Supine Lower Trunk Rotation  - 3 x daily - 7 x weekly - 1 sets -  3 reps - 20 hold - Seated Hamstring Stretch  - 3 x daily - 7 x weekly - 1 sets - 3 reps - 20 hold - Sit to Stand Without Arm Support  - 2 x daily - 7 x weekly - 2 sets - 10 reps -  Seated Scapular Retraction  - 5 x daily - 7 x weekly - 1 sets - 10 reps - Seated Upright Posture Correction  - 1 x daily - 7 x weekly - 1 sets - 10 reps - Standing Heel Raise with Chair Support  - 2 x daily - 7 x weekly - 2 sets - 10 reps - Standing Hip Abduction with Counter Support  - 1 x daily - 7 x weekly - 2 sets - 10 reps - Supine Bilateral Shoulder External Rotation with Resistance  - 2 x daily - 7 x weekly - 2 sets - 10 reps - Side Stepping with Resistance at Thighs  - 1 x daily - 7 x weekly - Side Stepping with Resistance at Thighs  - 1 x daily - 7 x weekly - Single Leg Stance  - 2 x daily - 7 x weekly - 2 sets - 5 reps - 20 sec hold - Single Leg Balance with Clock Reach  - 1 x daily - 7 x weekly - 1-2 sets - 10 reps - Mini Squat with Counter Support  - 2 x daily - 7 x weekly - 1-2 sets - 10 reps - 3 sec  hold ASSESSMENT:  CLINICAL IMPRESSION: Melanie reports a fall since his last visit due to drinking too much and not knowing the layout of his new house well. He hurt his left ribs but states they feel better now. He is noncompliant with HEP reporting that he is busy with unpacking/moving. PT stressed importance of compliance in achieving LTGs. We focused on hip strength and balance today. Deo is fearful, but does more than he thinks he can with encouragement.  OBJECTIVE IMPAIRMENTS: decreased activity tolerance, decreased balance, difficulty walking, decreased strength, increased muscle spasms, impaired flexibility, improper body mechanics, postural dysfunction, and pain.   ACTIVITY LIMITATIONS: carrying, lifting, squatting, bed mobility, and locomotion level  PARTICIPATION LIMITATIONS: cleaning, laundry, and community activity  PERSONAL FACTORS: Age are also affecting patient's functional outcome.   REHAB POTENTIAL: Good  CLINICAL DECISION MAKING: Stable/uncomplicated  EVALUATION COMPLEXITY: Low   GOALS: Goals reviewed with patient? Yes  SHORT TERM GOALS: Target date:  03/19/2022  Be independent in initial HEP Baseline: Goal status: MET  2.  Verbalize and demonstrate body mechanics modifications for lumbar protection with ADLs and self-care Baseline:  Goal status: INITIAL  3.  Report > or = to 30% reduction in LBP with ADLs and self-care Baseline: >50% (03/11/22) Goal status: MET    LONG TERM GOALS: Target date: 04/16/2022  Be independent in advanced HEP Baseline:  Goal status: INITIAL  2.  Improve FOTO to > or = to 60 Baseline:  Goal status: INITIAL  3.  Perform 5x sit to stand in < or = to 13 seconds to improve balance  Baseline: 21 seconds, 18.33 sec 03/13/22 Goal status: IN PROGRESS  4.  Return to lifting and carrying for household tasks without limitation due to LBP Baseline:  Goal status: INITIAL  5.  Report > or = to 70% reduction in LBP with self-care and ADLs Baseline:  Goal status: INITIAL  6.  Perform bed mobility with min effort and report no pain with this  Baseline:  Goal status: MET  PLAN:  PT FREQUENCY: 1-2x/week  PT DURATION: 8 weeks  PLANNED INTERVENTIONS: Therapeutic exercises, Therapeutic activity, Neuromuscular re-education, Balance training, Gait training, Patient/Family education, Self Care, Joint mobilization, Joint manipulation, Aquatic Therapy, Dry Needling, Electrical stimulation, Spinal manipulation, Spinal mobilization, Cryotherapy, Moist heat, Taping, Traction, Ultrasound, Manual therapy, and Re-evaluation.  PLAN FOR NEXT SESSION: Focus on  glute med strength, functional activities like floor to stand transfers, squats/lunges and balance activities.   Adyen Bifulco, PT 03/27/22 2:57 PM

## 2022-03-27 ENCOUNTER — Ambulatory Visit: Payer: 59 | Admitting: Physical Therapy

## 2022-03-27 ENCOUNTER — Encounter: Payer: Self-pay | Admitting: Physical Therapy

## 2022-03-27 DIAGNOSIS — R293 Abnormal posture: Secondary | ICD-10-CM

## 2022-03-27 DIAGNOSIS — M6281 Muscle weakness (generalized): Secondary | ICD-10-CM

## 2022-03-27 DIAGNOSIS — M5459 Other low back pain: Secondary | ICD-10-CM | POA: Diagnosis not present

## 2022-03-27 DIAGNOSIS — M546 Pain in thoracic spine: Secondary | ICD-10-CM

## 2022-03-27 DIAGNOSIS — R252 Cramp and spasm: Secondary | ICD-10-CM

## 2022-04-01 ENCOUNTER — Ambulatory Visit: Payer: 59 | Attending: Family Medicine | Admitting: Rehabilitative and Restorative Service Providers"

## 2022-04-01 ENCOUNTER — Encounter: Payer: Self-pay | Admitting: Rehabilitative and Restorative Service Providers"

## 2022-04-01 DIAGNOSIS — M5459 Other low back pain: Secondary | ICD-10-CM | POA: Diagnosis present

## 2022-04-01 DIAGNOSIS — R293 Abnormal posture: Secondary | ICD-10-CM | POA: Insufficient documentation

## 2022-04-01 DIAGNOSIS — M546 Pain in thoracic spine: Secondary | ICD-10-CM | POA: Insufficient documentation

## 2022-04-01 DIAGNOSIS — R252 Cramp and spasm: Secondary | ICD-10-CM | POA: Diagnosis present

## 2022-04-01 DIAGNOSIS — M6281 Muscle weakness (generalized): Secondary | ICD-10-CM | POA: Insufficient documentation

## 2022-04-01 NOTE — Therapy (Signed)
OUTPATIENT PHYSICAL THERAPY TREATMENT   Patient Name: Donald [name] MRN: [id number] DOB:January 21, 1956, 66 y.o., male Today's Date: 04/01/2022   PT End of Session - 04/01/22 1107     Visit Number 8    Date for PT Re-Evaluation 04/16/22    Authorization Type [employer]    PT Start Time 1105    PT Stop Time 1145    PT Time Calculation (min) 40 min    Activity Tolerance Patient tolerated treatment well                Past Medical History:  Diagnosis Date   Arthritis    Asthma    Hyperlipidemia    Hypertension    Sleep apnea    cpap settings ? 10    Past Surgical History:  Procedure Laterality Date   HERNIA REPAIR     x 2    SINUS EXPLORATION     TOTAL HIP ARTHROPLASTY Right 03/10/2014   Procedure: RIGHT TOTAL HIP ARTHROPLASTY ANTERIOR APPROACH;  Surgeon: Mcarthur Rossetti, MD;  Location: [facility];  Service: Orthopedics;  Laterality: Right;   TOTAL HIP ARTHROPLASTY Left 02/13/2017   Procedure: LEFT TOTAL HIP ARTHROPLASTY ANTERIOR APPROACH;  Surgeon: Mcarthur Rossetti, MD;  Location: [facility];  Service: Orthopedics;  Laterality: Left;   URETEROSCOPY     multiple times    Patient Active Problem List   Diagnosis Date Noted   Angioedema of lips 07/12/2017   Hypertension 07/12/2017   Hyponatremia 07/12/2017   Psoriasis    Osteoarthritis of left hip 02/13/2017   Status post total replacement of left hip 02/13/2017   Primary osteoarthritis of right hip 03/10/2014   Status post total replacement of right hip 03/10/2014   Diastasis recti 02/21/2013    PCP: London Pepper, MD  REFERRING PROVIDER: Windy Carina, NP  REFERRING DIAG: M54.50 (ICD-10-CM) - Low back pain, unspecified  Rationale for Evaluation and Treatment Rehabilitation  THERAPY DIAG:  Other low back pain  Cramp and spasm  Abnormal posture  Muscle weakness (generalized)  Pain in thoracic spine  ONSET DATE: 01/2022  SUBJECTIVE:                                                                                                                                                                                            SUBJECTIVE STATEMENT: Patient reports he is not having back pain. He has not been doing his homework like he should.  PERTINENT HISTORY:  Asthma, HTN, sleep apnea   PAIN:  Are you having pain? Yes: NPRS scale: 0/10 Pain location: Low back  Pain description: sore, searing  Aggravating factors: getting  out of bed, standing, lifting  Relieving factors: muscle relaxers   PRECAUTIONS: None  WEIGHT BEARING RESTRICTIONS: No  FALLS:  Has patient fallen in last 6 months? Yes. Number of falls many in the past year 1 (PT will address)  LIVING ENVIRONMENT: Lives with: lives with their family Lives in: House/apartment Stairs: Yes: External: 2 steps; on right going up Has following equipment at home: None  OCCUPATION: retired   PATIENT GOALS: reduce LBP/thoracic pain   OBJECTIVE:   DIAGNOSTIC FINDINGS:  X-ray: IMPRESSION: 1. No acute fracture. 2. Mild anterolisthesis at L4-L5. 3. Degenerative changes in the lower lumbar spine.  PATIENT SURVEYS:  FOTO 31 (60 is goal)   MUSCLE LENGTH: Limited by 25%   LOWER EXTREMITY ROM:    Limited by 25% bilaterally  LOWER EXTREMITY MMT:   Hip 4/5, knee 4+/5, bil shoulders 4+/5 Bed Mobility: max effort from pt and required moderate verbal cues for log roll  FUNCTIONAL TESTS:  5 times sit to stand: 21.29  DATE: 04/01/22:    Therapeutic exercises:  Nustep L6 x 7 min UE 10; seat 7  Sit to stand x 10 x 2 sets UE's over chest Row blue TB x 10 x 2 sets  Shoulder extension blue TB x 10 x 2  Hip abduction green TB leading with heel 10 x 2 sets each LE  Antirotation blue TB x 10 x 2 sets each side  Side steps at counter green TB above knees 10 ft x 5 reps each direction Standing one foot forward, narrow base UE support as needed x 5 each side  ~ 20 sec  Standing UE support at counter cabinet - fwd/side/back tap  switching LE's x 10  Narrow BOS; then with vertical head movements; head turns x 5 ea Narrow BOS eyes closed 2 x 15 sec; 2 x 20 sec  Toe taps x 10 ea each side at rail, no UE support Modified deadlift from 8 in stool 10# KB x 10; 15# KB x 10 reps    Date: 03/27/22:  Sit to stand x 10 more narrow base, UE's over chest Hip abduction green TB leading with heel 10 x 2 sets each LE  Side steps at counter green TB above knees 10 ft x 5 reps each direction Standing one foot forward, narrow base UE support as needed x 5 each side  ~ 20 sec  Narrow BOS; then with vertical head movements; head turns x 5 ea Narrow BOS eyes closed 2 rounds 20 sec  Toe taps 4 in step x 5 ea each side with mat table behind, no UE support     PATIENT EDUCATION:  Education details: posture and body mechanics for typical daily postioning, mobility and household tasks' Person educated: Patient Education method: Consulting civil engineer, Media planner, and Handouts Education comprehension: verbalized understanding and returned demonstration  HOME EXERCISE PROGRAM:  Access Code: [email or web address] URL: [email or web address] Date: 03/18/2022 Prepared by: Gillermo Murdoch  Exercises - Supine Lower Trunk Rotation  - 3 x daily - 7 x weekly - 1 sets - 3 reps - 20 hold - Seated Hamstring Stretch  - 3 x daily - 7 x weekly - 1 sets - 3 reps - 20 hold - Sit to Stand Without Arm Support  - 2 x daily - 7 x weekly - 2 sets - 10 reps - Seated Scapular Retraction  - 5 x daily - 7 x weekly - 1 sets - 10 reps - Seated Upright Posture Correction  - 1 x daily -  7 x weekly - 1 sets - 10 reps - Standing Heel Raise with Chair Support  - 2 x daily - 7 x weekly - 2 sets - 10 reps - Standing Hip Abduction with Counter Support  - 1 x daily - 7 x weekly - 2 sets - 10 reps - Supine Bilateral Shoulder External Rotation with Resistance  - 2 x daily - 7 x weekly - 2 sets - 10 reps - Side Stepping with Resistance at Thighs  - 1 x daily - 7 x weekly - Side  Stepping with Resistance at Thighs  - 1 x daily - 7 x weekly - Single Leg Stance  - 2 x daily - 7 x weekly - 2 sets - 5 reps - 20 sec hold - Single Leg Balance with Clock Reach  - 1 x daily - 7 x weekly - 1-2 sets - 10 reps - Mini Squat with Counter Support  - 2 x daily - 7 x weekly - 1-2 sets - 10 reps - 3 sec  hold  ASSESSMENT:  CLINICAL IMPRESSION: Denis reports no more falls since his last visit. He is still noncompliant with HEP reporting that he is busy with unpacking/moving and appointments. PT stressed importance of compliance in achieving LTGs. We focused on hip strength and balance today. Good exercise tolerance today.   GOALS: Goals reviewed with patient? Yes  SHORT TERM GOALS: Target date: 03/19/2022  Be independent in initial HEP Baseline: Goal status: MET  2.  Verbalize and demonstrate body mechanics modifications for lumbar protection with ADLs and self-care Baseline:  Goal status: INITIAL  3.  Report > or = to 30% reduction in LBP with ADLs and self-care Baseline: >50% (03/11/22) Goal status: MET    LONG TERM GOALS: Target date: 04/16/2022  Be independent in advanced HEP Baseline:  Goal status: INITIAL  2.  Improve FOTO to > or = to 60 Baseline:  Goal status: INITIAL  3.  Perform 5x sit to stand in < or = to 13 seconds to improve balance  Baseline: 21 seconds, 18.33 sec 03/13/22 Goal status: IN PROGRESS  4.  Return to lifting and carrying for household tasks without limitation due to LBP Baseline:  Goal status: INITIAL  5.  Report > or = to 70% reduction in LBP with self-care and ADLs Baseline:  Goal status: INITIAL  6.  Perform bed mobility with min effort and report no pain with this  Baseline:  Goal status: MET  PLAN:  PT FREQUENCY: 1-2x/week  PT DURATION: 8 weeks  PLANNED INTERVENTIONS: Therapeutic exercises, Therapeutic activity, Neuromuscular re-education, Balance training, Gait training, Patient/Family education, Self Care, Joint  mobilization, Joint manipulation, Aquatic Therapy, Dry Needling, Electrical stimulation, Spinal manipulation, Spinal mobilization, Cryotherapy, Moist heat, Taping, Traction, Ultrasound, Manual therapy, and Re-evaluation.  PLAN FOR NEXT SESSION: Focus on  glute med strength, functional activities like floor to stand transfers, squats/lunges and balance activities.   Everardo All, PT, MPH  04/01/22 11:07 AM

## 2022-04-03 ENCOUNTER — Ambulatory Visit: Payer: 59 | Admitting: Rehabilitative and Restorative Service Providers"

## 2022-04-03 ENCOUNTER — Encounter: Payer: Self-pay | Admitting: Rehabilitative and Restorative Service Providers"

## 2022-04-03 ENCOUNTER — Encounter: Payer: Self-pay | Admitting: Home Modifications

## 2022-04-03 ENCOUNTER — Other Ambulatory Visit: Payer: Self-pay | Admitting: Home Modifications

## 2022-04-03 DIAGNOSIS — E210 Primary hyperparathyroidism: Secondary | ICD-10-CM

## 2022-04-03 DIAGNOSIS — M6281 Muscle weakness (generalized): Secondary | ICD-10-CM

## 2022-04-03 DIAGNOSIS — M546 Pain in thoracic spine: Secondary | ICD-10-CM

## 2022-04-03 DIAGNOSIS — M5459 Other low back pain: Secondary | ICD-10-CM

## 2022-04-03 DIAGNOSIS — R293 Abnormal posture: Secondary | ICD-10-CM

## 2022-04-03 DIAGNOSIS — R252 Cramp and spasm: Secondary | ICD-10-CM

## 2022-04-03 NOTE — Therapy (Signed)
OUTPATIENT PHYSICAL THERAPY TREATMENT   Patient Name: Donald [name] MRN: [id number] DOB:[birth year], 66 y.o., male Today's Date: 04/03/2022   PT End of Session - 04/03/22 1104     Visit Number 9    Date for PT Re-Evaluation 04/16/22    Authorization Type [employer]    PT Start Time 1102    PT Stop Time 1145    PT Time Calculation (min) 43 min    Activity Tolerance Patient tolerated treatment well                Past Medical History:  Diagnosis Date   Arthritis    Asthma    Hyperlipidemia    Hypertension    Sleep apnea    cpap settings ? 10    Past Surgical History:  Procedure Laterality Date   HERNIA REPAIR     x 2    SINUS EXPLORATION     TOTAL HIP ARTHROPLASTY Right 03/10/2014   Procedure: RIGHT TOTAL HIP ARTHROPLASTY ANTERIOR APPROACH;  Surgeon: Mcarthur Rossetti, MD;  Location: [facility];  Service: Orthopedics;  Laterality: Right;   TOTAL HIP ARTHROPLASTY Left 02/13/2017   Procedure: LEFT TOTAL HIP ARTHROPLASTY ANTERIOR APPROACH;  Surgeon: Mcarthur Rossetti, MD;  Location: [facility];  Service: Orthopedics;  Laterality: Left;   URETEROSCOPY     multiple times    Patient Active Problem List   Diagnosis Date Noted   Angioedema of lips 07/12/2017   Hypertension 07/12/2017   Hyponatremia 07/12/2017   Psoriasis    Osteoarthritis of left hip 02/13/2017   Status post total replacement of left hip 02/13/2017   Primary osteoarthritis of right hip 03/10/2014   Status post total replacement of right hip 03/10/2014   Diastasis recti 02/21/2013    PCP: London Pepper, MD  REFERRING PROVIDER: Windy Carina, NP  REFERRING DIAG: M54.50 (ICD-10-CM) - Low back pain, unspecified  Rationale for Evaluation and Treatment Rehabilitation  THERAPY DIAG:  Other low back pain  Cramp and spasm  Abnormal posture  Muscle weakness (generalized)  Pain in thoracic spine  ONSET DATE: 01/2022  SUBJECTIVE:                                                                                                                                                                                            SUBJECTIVE STATEMENT: Patient reports that his legs were sore from last treatment but no pain in legs or back. He has not been doing his homework like he should.  PERTINENT HISTORY:  Asthma, HTN, sleep apnea   PAIN:  Are you having pain? Yes: NPRS scale: 0/10 Pain location: Low back  Pain description: sore, searing  Aggravating factors: getting out of bed, standing, lifting  Relieving factors: muscle relaxers   PRECAUTIONS: None  WEIGHT BEARING RESTRICTIONS: No  FALLS:  Has patient fallen in last 6 months? Yes. Number of falls many in the past year 1 (PT will address)  LIVING ENVIRONMENT: Lives with: lives with their family Lives in: House/apartment Stairs: Yes: External: 2 steps; on right going up Has following equipment at home: None  OCCUPATION: retired   PATIENT GOALS: reduce LBP/thoracic pain   OBJECTIVE:   DIAGNOSTIC FINDINGS:  X-ray: IMPRESSION: 1. No acute fracture. 2. Mild anterolisthesis at L4-L5. 3. Degenerative changes in the lower lumbar spine.  PATIENT SURVEYS:  FOTO 42 (60 is goal)   MUSCLE LENGTH: Limited by 25%   LOWER EXTREMITY ROM:    Limited by 25% bilaterally  LOWER EXTREMITY MMT:   Hip 4/5, knee 4+/5, bil shoulders 4+/5 Bed Mobility: max effort from pt and required moderate verbal cues for log roll  FUNCTIONAL TESTS:  5 times sit to stand: Eval: 21.29; 04/03/22: 18.03 sec  HOME EXERCISES: Access Code: [email or web address] URL: [email or web address] Date: 04/03/2022 Prepared by: Gillermo Murdoch  Exercises - Seated Cervical Retraction  - 3 x daily - 7 x weekly - 1 sets - 10 reps - Standing Scapular Retraction  - 3 x daily - 7 x weekly - 1 sets - 10 reps - 10 hold - Shoulder External Rotation and Scapular Retraction  - 3 x daily - 7 x weekly - 1 sets - 10 reps -   hold - Doorway Pec Stretch at 60 Degrees Abduction  -  3 x daily - 7 x weekly - 1 sets - 3 reps - Doorway Pec Stretch at 90 Degrees Abduction  - 3 x daily - 7 x weekly - 1 sets - 3 reps - 30 seconds  hold - Doorway Pec Stretch at 120 Degrees Abduction  - 3 x daily - 7 x weekly - 1 sets - 3 reps - 30 second hold  hold  Patient Education - Biomedical scientist - Office Posture - TENS Unit - Trigger Point Dry Needling  TREATMENT: DATE: 04/03/22: Therapeutic exercises:  Nustep L6 x 7 min UE 10; seat 7  Sit to stand x 10 x 2 sets  Row blue TB x 10 x 2 sets  Shoulder extension blue TB x 10 x 2  Antirotation blue TB x 10 x 2 sets each side  Standing one foot forward, narrow base UE support as needed x 2 each side  30 sec  Narrow BOS; then with vertical head turns; horizontal head turns x 10 ea Narrow BOS reaching overhead x 10; shoulder ab/adduction x 10  Narrow BOS eyes closed 3 x 30 sec back in corner counter and wall  Toe taps fwd/side/back x 10 ea at counter UE support one hand as needed for balance Walking holding cup of water x 40 ft x 2  Walking with head turns vertically and horizontally 40 ft x 2 each  Modified deadlift 8 in stool 15# KB x 10 reps x 2    DATE: 04/01/22: Therapeutic exercises:  Nustep L6 x 7 min UE 10; seat 7  Sit to stand x 10 x 2 sets UE's over chest Row blue TB x 10 x 2 sets  Shoulder extension blue TB x 10 x 2  Hip abduction green TB leading with heel 10 x 2 sets each LE  Antirotation blue TB x 10 x 2 sets each  side  Side steps at counter green TB above knees 10 ft x 5 reps each direction Standing one foot forward, narrow base UE support as needed x 5 each side  ~ 20 sec  Standing UE support at counter cabinet - fwd/side/back tap switching LE's x 10  Narrow BOS; then with vertical head movements; head turns x 5 ea Narrow BOS eyes closed 2 x 15 sec; 2 x 20 sec  Toe taps x 10 ea each side at rail, no UE support Modified deadlift from 8 in stool 10# KB x 10; 15# KB x 10 reps    PATIENT EDUCATION:   Education details: posture and body mechanics for typical daily postioning, mobility and household tasks' Person educated: Patient Education method: Consulting civil engineer, Media planner, and Handouts Education comprehension: verbalized understanding and returned demonstration  HOME EXERCISE PROGRAM:  Access Code: [email or web address] URL: [email or web address] Date: 03/18/2022 Prepared by: Gillermo Murdoch  Exercises - Supine Lower Trunk Rotation  - 3 x daily - 7 x weekly - 1 sets - 3 reps - 20 hold - Seated Hamstring Stretch  - 3 x daily - 7 x weekly - 1 sets - 3 reps - 20 hold - Sit to Stand Without Arm Support  - 2 x daily - 7 x weekly - 2 sets - 10 reps - Seated Scapular Retraction  - 5 x daily - 7 x weekly - 1 sets - 10 reps - Seated Upright Posture Correction  - 1 x daily - 7 x weekly - 1 sets - 10 reps - Standing Heel Raise with Chair Support  - 2 x daily - 7 x weekly - 2 sets - 10 reps - Standing Hip Abduction with Counter Support  - 1 x daily - 7 x weekly - 2 sets - 10 reps - Supine Bilateral Shoulder External Rotation with Resistance  - 2 x daily - 7 x weekly - 2 sets - 10 reps - Side Stepping with Resistance at Thighs  - 1 x daily - 7 x weekly - Side Stepping with Resistance at Thighs  - 1 x daily - 7 x weekly - Single Leg Stance  - 2 x daily - 7 x weekly - 2 sets - 5 reps - 20 sec hold - Single Leg Balance with Clock Reach  - 1 x daily - 7 x weekly - 1-2 sets - 10 reps - Mini Squat with Counter Support  - 2 x daily - 7 x weekly - 1-2 sets - 10 reps - 3 sec  hold  ASSESSMENT:  CLINICAL IMPRESSION: Eyal reports that his legs were sore following last PT session. He is still not consistent with HEP reporting that he is busy with unpacking/moving and appointments. Patient demonstrated decreased time with 5 times sit to stand test. PT stressed importance of compliance in achieving LTGs. Continued focused on hip strength and balance activities. Improving exercise tolerance noted.    GOALS: Goals reviewed with patient? Yes  SHORT TERM GOALS: Target date: 03/19/2022  Be independent in initial HEP Baseline: Goal status: MET  2.  Verbalize and demonstrate body mechanics modifications for lumbar protection with ADLs and self-care Baseline:  Goal status: INITIAL  3.  Report > or = to 30% reduction in LBP with ADLs and self-care Baseline: >50% (03/11/22) Goal status: MET    LONG TERM GOALS: Target date: 04/16/2022  Be independent in advanced HEP Baseline:  Goal status: INITIAL  2.  Improve FOTO to > or = to 60 Baseline:  Goal status:  INITIAL  3.  Perform 5x sit to stand in < or = to 13 seconds to improve balance  Baseline: 21 seconds, 18.33 sec 03/13/22 Goal status: IN PROGRESS  4.  Return to lifting and carrying for household tasks without limitation due to LBP Baseline:  Goal status: INITIAL  5.  Report > or = to 70% reduction in LBP with self-care and ADLs Baseline:  Goal status: INITIAL  6.  Perform bed mobility with min effort and report no pain with this  Baseline:  Goal status: MET  PLAN:  PT FREQUENCY: 1-2x/week  PT DURATION: 8 weeks  PLANNED INTERVENTIONS: Therapeutic exercises, Therapeutic activity, Neuromuscular re-education, Balance training, Gait training, Patient/Family education, Self Care, Joint mobilization, Joint manipulation, Aquatic Therapy, Dry Needling, Electrical stimulation, Spinal manipulation, Spinal mobilization, Cryotherapy, Moist heat, Taping, Traction, Ultrasound, Manual therapy, and Re-evaluation.  PLAN FOR NEXT SESSION: Focus on  glute med strength, functional activities like floor to stand transfers, squats/lunges and balance activities.   Everardo All, PT, MPH  04/03/22 11:07 AM

## 2022-04-08 ENCOUNTER — Ambulatory Visit: Payer: 59 | Admitting: Rehabilitative and Restorative Service Providers"

## 2022-04-08 ENCOUNTER — Encounter: Payer: Self-pay | Admitting: Rehabilitative and Restorative Service Providers"

## 2022-04-08 DIAGNOSIS — R252 Cramp and spasm: Secondary | ICD-10-CM

## 2022-04-08 DIAGNOSIS — M5459 Other low back pain: Secondary | ICD-10-CM

## 2022-04-08 DIAGNOSIS — M6281 Muscle weakness (generalized): Secondary | ICD-10-CM

## 2022-04-08 DIAGNOSIS — R293 Abnormal posture: Secondary | ICD-10-CM

## 2022-04-08 NOTE — Therapy (Signed)
OUTPATIENT PHYSICAL THERAPY TREATMENT   Patient Name: Donald [name] MRN: [id number] DOB:29-Mar-1956, 66 y.o., male Today's Date: 04/08/2022   PT End of Session - 04/08/22 1159     Visit Number 10    Date for PT Re-Evaluation 04/16/22    Authorization Type [employer]    PT Start Time 1145    PT Stop Time 1230    PT Time Calculation (min) 45 min    Activity Tolerance Patient tolerated treatment well                Past Medical History:  Diagnosis Date   Arthritis    Asthma    Hyperlipidemia    Hypertension    Sleep apnea    cpap settings ? 10    Past Surgical History:  Procedure Laterality Date   HERNIA REPAIR     x 2    SINUS EXPLORATION     TOTAL HIP ARTHROPLASTY Right 03/10/2014   Procedure: RIGHT TOTAL HIP ARTHROPLASTY ANTERIOR APPROACH;  Surgeon: Mcarthur Rossetti, MD;  Location: [facility];  Service: Orthopedics;  Laterality: Right;   TOTAL HIP ARTHROPLASTY Left 02/13/2017   Procedure: LEFT TOTAL HIP ARTHROPLASTY ANTERIOR APPROACH;  Surgeon: Mcarthur Rossetti, MD;  Location: [facility];  Service: Orthopedics;  Laterality: Left;   URETEROSCOPY     multiple times    Patient Active Problem List   Diagnosis Date Noted   Angioedema of lips 07/12/2017   Hypertension 07/12/2017   Hyponatremia 07/12/2017   Psoriasis    Osteoarthritis of left hip 02/13/2017   Status post total replacement of left hip 02/13/2017   Primary osteoarthritis of right hip 03/10/2014   Status post total replacement of right hip 03/10/2014   Diastasis recti 02/21/2013    PCP: London Pepper, MD  REFERRING PROVIDER: Windy Carina, NP  REFERRING DIAG: M54.50 (ICD-10-CM) - Low back pain, unspecified  Rationale for Evaluation and Treatment Rehabilitation  THERAPY DIAG:  Other low back pain  Cramp and spasm  Abnormal posture  Muscle weakness (generalized)  ONSET DATE: 01/2022  SUBJECTIVE:                                                                                                                                                                                            SUBJECTIVE STATEMENT: Patient reports that he had some soreness in his legs rom last treatment but no pain in legs or back. He has not been doing his exercises at home.  PERTINENT HISTORY:  Asthma, HTN, sleep apnea   PAIN:  Are you having pain? Yes: NPRS scale: 0/10 Pain location: Low back  Pain description:  sore, searing  Aggravating factors: getting out of bed, standing, lifting  Relieving factors: muscle relaxers   PRECAUTIONS: None  WEIGHT BEARING RESTRICTIONS: No  FALLS:  Has patient fallen in last 6 months? Yes. Number of falls many in the past year 1 (PT will address)  LIVING ENVIRONMENT: Lives with: lives with their family Lives in: House/apartment Stairs: Yes: External: 2 steps; on right going up Has following equipment at home: None  OCCUPATION: retired   PATIENT GOALS: reduce LBP/thoracic pain   OBJECTIVE:   DIAGNOSTIC FINDINGS:  X-ray: IMPRESSION: 1. No acute fracture. 2. Mild anterolisthesis at L4-L5. 3. Degenerative changes in the lower lumbar spine.  PATIENT SURVEYS:  FOTO 42 (60 is goal)   MUSCLE LENGTH: Limited by 25%   LOWER EXTREMITY ROM:    Limited by 25% bilaterally  LOWER EXTREMITY MMT:   Hip 4/5, knee 4+/5, bil shoulders 4+/5 Bed Mobility: max effort from pt and required moderate verbal cues for log roll  FUNCTIONAL TESTS:  5 times sit to stand: Eval: 21.29; 04/03/22: 18.03 sec  HOME EXERCISES: Access Code: [email or web address] URL: [email or web address] Date: 04/03/2022 Prepared by: Gillermo Murdoch  Exercises - Seated Cervical Retraction  - 3 x daily - 7 x weekly - 1 sets - 10 reps - Standing Scapular Retraction  - 3 x daily - 7 x weekly - 1 sets - 10 reps - 10 hold - Shoulder External Rotation and Scapular Retraction  - 3 x daily - 7 x weekly - 1 sets - 10 reps -   hold - Doorway Pec Stretch at 60 Degrees Abduction  - 3 x daily - 7 x  weekly - 1 sets - 3 reps - Doorway Pec Stretch at 90 Degrees Abduction  - 3 x daily - 7 x weekly - 1 sets - 3 reps - 30 seconds  hold - Doorway Pec Stretch at 120 Degrees Abduction  - 3 x daily - 7 x weekly - 1 sets - 3 reps - 30 second hold  hold  Patient Education - Biomedical scientist - Office Posture - TENS Unit - Trigger Point Dry Needling  TREATMENT: Date: 04/08/22:  Therapeutic exercises:  Nustep L6 x 10 min UE 10; seat 7  Sit to stand x 10 x 2 sets  Partial squat x 10 x 2 sets Step up 6 in step x 10 UE support as needed  Row blue TB x 10 x 2 sets  Shoulder extension blue TB x 10 x 2  Antirotation blue TB x 10 x 2 sets each side  Standing one foot forward, narrow base UE support as needed x 2 each side x 30 sec  Narrow BOS; then with vertical head turns; horizontal head turns x 10 ea Narrow BOS reaching overhead x 10; shoulder ab/adduction x 10  Narrow BOS eyes closed 3 x 30 sec  Toe taps to 12 in step alt LE's x 20 each LE  Toe taps fwd/side/back x 10 ea at counter UE support one hand as needed for balance Walking with head turns vertically and horizontally 40 ft x 2 each  Modified deadlift 8 in stool 15# KB x 10 reps x 2   DATE: 04/03/22: Therapeutic exercises:  Nustep L6 x 7 min UE 10; seat 7  Sit to stand x 10 x 2 sets  Row blue TB x 10 x 2 sets  Shoulder extension blue TB x 10 x 2  Antirotation blue TB x 10 x 2 sets each side  Standing  one foot forward, narrow base UE support as needed x 2 each side  30 sec  Narrow BOS; then with vertical head turns; horizontal head turns x 10 ea Narrow BOS reaching overhead x 10; shoulder ab/adduction x 10  Narrow BOS eyes closed 3 x 30 sec back in corner counter and wall  Toe taps fwd/side/back x 10 ea at counter UE support one hand as needed for balance Walking with head turns vertically and horizontally 40 ft x 2 each  Modified deadlift 8 in stool 15# KB x 10 reps x 2     PATIENT EDUCATION:  Education details:  posture and body mechanics for typical daily postioning, mobility and household tasks' Person educated: Patient Education method: Consulting civil engineer, Media planner, and Handouts Education comprehension: verbalized understanding and returned demonstration  HOME EXERCISE PROGRAM:  Access Code: [email or web address] URL: [email or web address] Date: 03/18/2022 Prepared by: Gillermo Murdoch  Exercises - Supine Lower Trunk Rotation  - 3 x daily - 7 x weekly - 1 sets - 3 reps - 20 hold - Seated Hamstring Stretch  - 3 x daily - 7 x weekly - 1 sets - 3 reps - 20 hold - Sit to Stand Without Arm Support  - 2 x daily - 7 x weekly - 2 sets - 10 reps - Seated Scapular Retraction  - 5 x daily - 7 x weekly - 1 sets - 10 reps - Seated Upright Posture Correction  - 1 x daily - 7 x weekly - 1 sets - 10 reps - Standing Heel Raise with Chair Support  - 2 x daily - 7 x weekly - 2 sets - 10 reps - Standing Hip Abduction with Counter Support  - 1 x daily - 7 x weekly - 2 sets - 10 reps - Supine Bilateral Shoulder External Rotation with Resistance  - 2 x daily - 7 x weekly - 2 sets - 10 reps - Side Stepping with Resistance at Thighs  - 1 x daily - 7 x weekly - Side Stepping with Resistance at Thighs  - 1 x daily - 7 x weekly - Single Leg Stance  - 2 x daily - 7 x weekly - 2 sets - 5 reps - 20 sec hold - Single Leg Balance with Clock Reach  - 1 x daily - 7 x weekly - 1-2 sets - 10 reps - Mini Squat with Counter Support  - 2 x daily - 7 x weekly - 1-2 sets - 10 reps - 3 sec  hold  ASSESSMENT:  CLINICAL IMPRESSION: Trevante reports that his legs are sore following therapy. He is not consistent with HEP reporting that he is busy with unpacking/moving and appointments. Patient demonstrated decreased time with 5 times sit to stand test. PT stressed importance of compliance in achieving LTGs. Continued focused on hip strength and balance activities. Improving exercise tolerance observed.    GOALS: Goals reviewed with patient?  Yes  SHORT TERM GOALS: Target date: 03/19/2022  Be independent in initial HEP Baseline: Goal status: MET  2.  Verbalize and demonstrate body mechanics modifications for lumbar protection with ADLs and self-care Baseline:  Goal status: INITIAL  3.  Report > or = to 30% reduction in LBP with ADLs and self-care Baseline: >50% (03/11/22) Goal status: MET    LONG TERM GOALS: Target date: 04/16/2022  Be independent in advanced HEP Baseline:  Goal status: INITIAL  2.  Improve FOTO to > or = to 60 Baseline:  Goal status: INITIAL  3.  Perform 5x  sit to stand in < or = to 13 seconds to improve balance  Baseline: 21 seconds, 18.33 sec 03/13/22 Goal status: IN PROGRESS  4.  Return to lifting and carrying for household tasks without limitation due to LBP Baseline:  Goal status: INITIAL  5.  Report > or = to 70% reduction in LBP with self-care and ADLs Baseline:  Goal status: INITIAL  6.  Perform bed mobility with min effort and report no pain with this  Baseline:  Goal status: MET  PLAN:  PT FREQUENCY: 1-2x/week  PT DURATION: 8 weeks  PLANNED INTERVENTIONS: Therapeutic exercises, Therapeutic activity, Neuromuscular re-education, Balance training, Gait training, Patient/Family education, Self Care, Joint mobilization, Joint manipulation, Aquatic Therapy, Dry Needling, Electrical stimulation, Spinal manipulation, Spinal mobilization, Cryotherapy, Moist heat, Taping, Traction, Ultrasound, Manual therapy, and Re-evaluation.  PLAN FOR NEXT SESSION: Focus on  glute med strength, functional activities like floor to stand transfers, squats/lunges and balance activities.   Everardo All, PT, MPH  04/08/22 12:14 PM

## 2022-04-10 ENCOUNTER — Ambulatory Visit: Payer: 59 | Admitting: Rehabilitative and Restorative Service Providers"

## 2022-04-10 ENCOUNTER — Encounter: Payer: Self-pay | Admitting: Rehabilitative and Restorative Service Providers"

## 2022-04-10 DIAGNOSIS — M6281 Muscle weakness (generalized): Secondary | ICD-10-CM

## 2022-04-10 DIAGNOSIS — R293 Abnormal posture: Secondary | ICD-10-CM

## 2022-04-10 DIAGNOSIS — M5459 Other low back pain: Secondary | ICD-10-CM | POA: Diagnosis not present

## 2022-04-10 DIAGNOSIS — R252 Cramp and spasm: Secondary | ICD-10-CM

## 2022-04-10 DIAGNOSIS — M546 Pain in thoracic spine: Secondary | ICD-10-CM

## 2022-04-10 NOTE — Therapy (Signed)
OUTPATIENT PHYSICAL THERAPY TREATMENT   Patient Name: Donald [name] MRN: [id number] DOB:Jan 06, 1956, 66 y.o., male Today's Date: 04/10/2022   PT End of Session - 04/10/22 1150     Visit Number 11    Date for PT Re-Evaluation 04/16/22    Authorization Type [employer]    PT Start Time 1147    PT Stop Time 1230    PT Time Calculation (min) 43 min    Activity Tolerance Patient tolerated treatment well                Past Medical History:  Diagnosis Date   Arthritis    Asthma    Hyperlipidemia    Hypertension    Sleep apnea    cpap settings ? 10    Past Surgical History:  Procedure Laterality Date   HERNIA REPAIR     x 2    SINUS EXPLORATION     TOTAL HIP ARTHROPLASTY Right 03/10/2014   Procedure: RIGHT TOTAL HIP ARTHROPLASTY ANTERIOR APPROACH;  Surgeon: Mcarthur Rossetti, MD;  Location: [facility];  Service: Orthopedics;  Laterality: Right;   TOTAL HIP ARTHROPLASTY Left 02/13/2017   Procedure: LEFT TOTAL HIP ARTHROPLASTY ANTERIOR APPROACH;  Surgeon: Mcarthur Rossetti, MD;  Location: [facility];  Service: Orthopedics;  Laterality: Left;   URETEROSCOPY     multiple times    Patient Active Problem List   Diagnosis Date Noted   Angioedema of lips 07/12/2017   Hypertension 07/12/2017   Hyponatremia 07/12/2017   Psoriasis    Osteoarthritis of left hip 02/13/2017   Status post total replacement of left hip 02/13/2017   Primary osteoarthritis of right hip 03/10/2014   Status post total replacement of right hip 03/10/2014   Diastasis recti 02/21/2013    PCP: London Pepper, MD  REFERRING PROVIDER: Windy Carina, NP  REFERRING DIAG: M54.50 (ICD-10-CM) - Low back pain, unspecified  Rationale for Evaluation and Treatment Rehabilitation  THERAPY DIAG:  Other low back pain  Cramp and spasm  Abnormal posture  Muscle weakness (generalized)  Pain in thoracic spine  ONSET DATE: 01/2022  SUBJECTIVE:                                                                                                                                                                                            SUBJECTIVE STATEMENT: Patient reports that he had some soreness in his legs after last treatment but no pain in legs or back. He has not been doing his exercises at home. Feels his balance is getting better  PERTINENT HISTORY:  Asthma, HTN, sleep apnea   PAIN:  Are you having pain?  Yes: NPRS scale: 0/10 Pain location: Low back  Pain description: sore, searing  Aggravating factors: getting out of bed, standing, lifting  Relieving factors: muscle relaxers   PRECAUTIONS: None  WEIGHT BEARING RESTRICTIONS: No  FALLS:  Has patient fallen in last 6 months? Yes. Number of falls many in the past year 1 (PT will address)  LIVING ENVIRONMENT: Lives with: lives with their family Lives in: House/apartment Stairs: Yes: External: 2 steps; on right going up Has following equipment at home: None  OCCUPATION: retired   PATIENT GOALS: reduce LBP/thoracic pain   OBJECTIVE:   DIAGNOSTIC FINDINGS:  X-ray: IMPRESSION: 1. No acute fracture. 2. Mild anterolisthesis at L4-L5. 3. Degenerative changes in the lower lumbar spine.  PATIENT SURVEYS:  FOTO 42 (60 is goal)   MUSCLE LENGTH: Limited by 25%   LOWER EXTREMITY ROM:    Limited by 25% bilaterally  LOWER EXTREMITY MMT:   Hip 4/5, knee 4+/5, bil shoulders 4+/5 Bed Mobility: max effort from pt and required moderate verbal cues for log roll  FUNCTIONAL TESTS:  5 times sit to stand: Eval: 21.29; 04/03/22: 18.03 sec  HOME EXERCISES: Access Code: [email or web address] URL: [email or web address] Date: 04/03/2022 Prepared by: Gillermo Murdoch  Exercises - Seated Cervical Retraction  - 3 x daily - 7 x weekly - 1 sets - 10 reps - Standing Scapular Retraction  - 3 x daily - 7 x weekly - 1 sets - 10 reps - 10 hold - Shoulder External Rotation and Scapular Retraction  - 3 x daily - 7 x weekly - 1 sets - 10 reps -   hold -  Doorway Pec Stretch at 60 Degrees Abduction  - 3 x daily - 7 x weekly - 1 sets - 3 reps - Doorway Pec Stretch at 90 Degrees Abduction  - 3 x daily - 7 x weekly - 1 sets - 3 reps - 30 seconds  hold - Doorway Pec Stretch at 120 Degrees Abduction  - 3 x daily - 7 x weekly - 1 sets - 3 reps - 30 second hold  hold  Patient Education - Biomedical scientist - Office Posture - TENS Unit - Trigger Point Dry Needling  TREATMENT: Date: 04/10/22:  Therapeutic exercises:  Nustep L6 x 10 min UE 10; seat 7 Sit to stand x 10 x 2 sets  Partial squat x 10 x 2 sets Step up 6 in step x 10 UE support as needed  Row blue TB x 10 x 2 sets  Shoulder extension blue TB x 10 x 2  Antirotation blue TB x 10 x 2 sets each side  SLS 30 sec x 3 each side UE fingertip as needed  Standing one foot forward, narrow base UE support as needed x 2 each side x 30 sec  Narrow BOS x 60 sec no UE support; then with vertical head turns; horizontal head turns x 10 ea Narrow BOS reaching overhead touching cabinet up/side/down x 10 Narrow BOS eyes closed 2 x 30 sec  Toe taps to 12 in step alt LE's x 20 each LE  Toe taps fwd/side/back x 10 ea in doorway UE support one hand as needed for balance Walking with head turns vertically and horizontally 40 ft x 2 each  Modified deadlift 8 in stool 20# KB x 10 reps x 2   Date: 04/08/22:  Therapeutic exercises:  Nustep L6 x 10 min UE 10; seat 7  Sit to stand x 10 x 2 sets  Partial squat x  10 x 2 sets Step up 6 in step x 10 UE support as needed  Row blue TB x 10 x 2 sets  Shoulder extension blue TB x 10 x 2  Antirotation blue TB x 10 x 2 sets each side  Standing one foot forward, narrow base UE support as needed x 2 each side x 30 sec  Narrow BOS; then with vertical head turns; horizontal head turns x 10 ea Narrow BOS reaching overhead x 10; shoulder ab/adduction x 10  Narrow BOS eyes closed 3 x 30 sec  Toe taps to 12 in step alt LE's x 20 each LE  Toe taps fwd/side/back x  10 ea at counter UE support one hand as needed for balance Walking with head turns vertically and horizontally 40 ft x 2 each  Modified deadlift 8 in stool 15# KB x 10 reps x 2    PATIENT EDUCATION:  Education details: posture and body mechanics for typical daily postioning, mobility and household tasks' Person educated: Patient Education method: Consulting civil engineer, Media planner, and Handouts Education comprehension: verbalized understanding and returned demonstration  HOME EXERCISE PROGRAM:  Access Code: [email or web address] URL: [email or web address] Date: 04/10/2022 Prepared by: Gillermo Murdoch  Exercises - Supine Lower Trunk Rotation  - 3 x daily - 7 x weekly - 1 sets - 3 reps - 20 hold - Seated Hamstring Stretch  - 3 x daily - 7 x weekly - 1 sets - 3 reps - 20 hold - Sit to Stand Without Arm Support  - 2 x daily - 7 x weekly - 2 sets - 10 reps - Seated Scapular Retraction  - 5 x daily - 7 x weekly - 1 sets - 10 reps - Seated Upright Posture Correction  - 1 x daily - 7 x weekly - 1 sets - 10 reps - Standing Heel Raise with Chair Support  - 2 x daily - 7 x weekly - 2 sets - 10 reps - Standing Hip Abduction with Counter Support  - 1 x daily - 7 x weekly - 2 sets - 10 reps - Supine Bilateral Shoulder External Rotation with Resistance  - 2 x daily - 7 x weekly - 2 sets - 10 reps - Side Stepping with Resistance at Thighs  - 1 x daily - 7 x weekly - Single Leg Stance  - 2 x daily - 7 x weekly - 2 sets - 5 reps - 20 sec hold - Single Leg Balance with Clock Reach  - 1 x daily - 7 x weekly - 1-2 sets - 10 reps - Mini Squat with Counter Support  - 2 x daily - 7 x weekly - 1-2 sets - 10 reps - 3 sec  hold - Tandem Stance  - 1 x daily - 7 x weekly - 1 sets - 3 reps - 30 sec  hold - Tandem Stance with Eyes Closed  - 1 x daily - 7 x weekly - 1 sets - 3 reps - 30 sec  hold - Modified Deadlift with Pelvic Contraction  - 1 x daily - 7 x weekly - 1 sets - 10 reps  ASSESSMENT:  CLINICAL  IMPRESSION: Reace reports improvement in balance with no falls. He has soreness in his legs following therapy. He is not consistent with HEP reporting that he is busy with unpacking/moving and appointments  but plans to exercise after Christmas. Patient demonstrated decreased time with 5 times sit to stand test. Continued focused on hip strength and balance activities. Improving exercise  tolerance noted each session. Printed exercises for patient's review and use for home. Encouraged home exercise to achieve further gains in strength and balance.   GOALS: Goals reviewed with patient? Yes  SHORT TERM GOALS: Target date: 03/19/2022  Be independent in initial HEP Baseline: Goal status: MET  2.  Verbalize and demonstrate body mechanics modifications for lumbar protection with ADLs and self-care Baseline:  Goal status: INITIAL  3.  Report > or = to 30% reduction in LBP with ADLs and self-care Baseline: >50% (03/11/22) Goal status: MET    LONG TERM GOALS: Target date: 04/16/2022  Be independent in advanced HEP Baseline:  Goal status: INITIAL  2.  Improve FOTO to > or = to 60 Baseline:  Goal status: INITIAL  3.  Perform 5x sit to stand in < or = to 13 seconds to improve balance  Baseline: 21 seconds, 18.33 sec 03/13/22 Goal status: IN PROGRESS  4.  Return to lifting and carrying for household tasks without limitation due to LBP Baseline:  Goal status: INITIAL  5.  Report > or = to 70% reduction in LBP with self-care and ADLs Baseline:  Goal status: INITIAL  6.  Perform bed mobility with min effort and report no pain with this  Baseline:  Goal status: MET  PLAN:  PT FREQUENCY: 1-2x/week  PT DURATION: 8 weeks  PLANNED INTERVENTIONS: Therapeutic exercises, Therapeutic activity, Neuromuscular re-education, Balance training, Gait training, Patient/Family education, Self Care, Joint mobilization, Joint manipulation, Aquatic Therapy, Dry Needling, Electrical stimulation,  Spinal manipulation, Spinal mobilization, Cryotherapy, Moist heat, Taping, Traction, Ultrasound, Manual therapy, and Re-evaluation.  PLAN FOR NEXT SESSION: Focus on  glute med strength, functional activities like floor to stand transfers, squats/lunges and balance activities.   Everardo All, PT, MPH  04/10/22 11:51 AM

## 2022-04-15 ENCOUNTER — Ambulatory Visit: Payer: 59 | Admitting: Rehabilitative and Restorative Service Providers"

## 2022-04-15 ENCOUNTER — Encounter: Payer: Self-pay | Admitting: Rehabilitative and Restorative Service Providers"

## 2022-04-15 DIAGNOSIS — M5459 Other low back pain: Secondary | ICD-10-CM | POA: Diagnosis not present

## 2022-04-15 DIAGNOSIS — R252 Cramp and spasm: Secondary | ICD-10-CM

## 2022-04-15 DIAGNOSIS — M6281 Muscle weakness (generalized): Secondary | ICD-10-CM

## 2022-04-15 DIAGNOSIS — R293 Abnormal posture: Secondary | ICD-10-CM

## 2022-04-15 DIAGNOSIS — M546 Pain in thoracic spine: Secondary | ICD-10-CM

## 2022-04-15 NOTE — Therapy (Signed)
OUTPATIENT PHYSICAL THERAPY TREATMENT and DISCHARGE SUMMARY   PHYSICAL THERAPY DISCHARGE SUMMARY  Visits from Start of Care: 12  Current functional level related to goals / functional outcomes: See progress note for discharge status    Remaining deficits: Needs to continue to work on strength, balance and endurance    Education / Equipment: HEP    Patient agrees to discharge. Patient goals were partially met. Patient is being discharged due to meeting the stated rehab goals. Patient needs to work on [employer].  Hiren Peplinski P. Helene Kelp PT, MPH 04/15/22 12:36 PM     Patient Name: Donald [name] MRN: [id number] DOB:09-01-1955, 66 y.o., male Today's Date: 04/15/2022   PT End of Session - 04/15/22 1152     Visit Number 12    Date for PT Re-Evaluation 04/16/22    Authorization Type [employer]    PT Start Time 1145    PT Stop Time 1230    PT Time Calculation (min) 45 min    Activity Tolerance Patient tolerated treatment well                Past Medical History:  Diagnosis Date   Arthritis    Asthma    Hyperlipidemia    Hypertension    Sleep apnea    cpap settings ? 10    Past Surgical History:  Procedure Laterality Date   HERNIA REPAIR     x 2    SINUS EXPLORATION     TOTAL HIP ARTHROPLASTY Right 03/10/2014   Procedure: RIGHT TOTAL HIP ARTHROPLASTY ANTERIOR APPROACH;  Surgeon: Mcarthur Rossetti, MD;  Location: [facility];  Service: Orthopedics;  Laterality: Right;   TOTAL HIP ARTHROPLASTY Left 02/13/2017   Procedure: LEFT TOTAL HIP ARTHROPLASTY ANTERIOR APPROACH;  Surgeon: Mcarthur Rossetti, MD;  Location: [facility];  Service: Orthopedics;  Laterality: Left;   URETEROSCOPY     multiple times    Patient Active Problem List   Diagnosis Date Noted   Angioedema of lips 07/12/2017   Hypertension 07/12/2017   Hyponatremia 07/12/2017   Psoriasis    Osteoarthritis of left hip 02/13/2017   Status post total replacement of left hip 02/13/2017   Primary osteoarthritis of right  hip 03/10/2014   Status post total replacement of right hip 03/10/2014   Diastasis recti 02/21/2013    PCP: London Pepper, MD  REFERRING PROVIDER: Windy Carina, NP  REFERRING DIAG: M54.50 (ICD-10-CM) - Low back pain, unspecified  Rationale for Evaluation and Treatment Rehabilitation  THERAPY DIAG:  Other low back pain  Cramp and spasm  Abnormal posture  Muscle weakness (generalized)  Pain in thoracic spine  ONSET DATE: 01/2022  SUBJECTIVE:  SUBJECTIVE STATEMENT: Patient reports that he had some soreness in his legs after last treatment but no pain in legs or back but resolves in a day or so. He has not been doing his exercises at home. Feels his balance is getting better. No pain.   PERTINENT HISTORY:  Asthma, HTN, sleep apnea   PAIN:  Are you having pain? Yes: NPRS scale: 0/10 Pain location: Low back  Pain description: sore, searing  Aggravating factors: getting out of bed, standing, lifting  Relieving factors: muscle relaxers   PRECAUTIONS: None  WEIGHT BEARING RESTRICTIONS: No  FALLS:  Has patient fallen in last 6 months? Yes. Number of falls many in the past year 1 (PT will address)  LIVING ENVIRONMENT: Lives with: lives with their family Lives in: House/apartment Stairs: Yes: External: 2 steps; on right going up Has following equipment at home: None  OCCUPATION: retired   PATIENT GOALS: reduce LBP/thoracic pain   OBJECTIVE:   DIAGNOSTIC FINDINGS:  X-ray: IMPRESSION: 1. No acute fracture. 2. Mild anterolisthesis at L4-L5. 3. Degenerative changes in the lower lumbar spine.  PATIENT SURVEYS:  FOTO 42 (60 is goal)   MUSCLE LENGTH: Limited by 25%   LOWER EXTREMITY ROM:    Limited by 25% bilaterally  LOWER EXTREMITY MMT:   Hip 4/5, knee 4+/5, bil shoulders  4+/5 Bed Mobility: max effort from pt and required moderate verbal cues for log roll  FUNCTIONAL TESTS:  5 times sit to stand: Eval: 21.29; 04/03/22: 18.03 sec  HOME EXERCISES: Access Code: [email or web address] URL: [email or web address] Date: 04/03/2022 Prepared by: Gillermo Murdoch  Exercises - Seated Cervical Retraction  - 3 x daily - 7 x weekly - 1 sets - 10 reps - Standing Scapular Retraction  - 3 x daily - 7 x weekly - 1 sets - 10 reps - 10 hold - Shoulder External Rotation and Scapular Retraction  - 3 x daily - 7 x weekly - 1 sets - 10 reps -   hold - Doorway Pec Stretch at 60 Degrees Abduction  - 3 x daily - 7 x weekly - 1 sets - 3 reps - Doorway Pec Stretch at 90 Degrees Abduction  - 3 x daily - 7 x weekly - 1 sets - 3 reps - 30 seconds  hold - Doorway Pec Stretch at 120 Degrees Abduction  - 3 x daily - 7 x weekly - 1 sets - 3 reps - 30 second hold  hold  Patient Education - Biomedical scientist - Office Posture - TENS Unit - Trigger Point Dry Needling  TREATMENT: Date: 04/15/22:  Therapeutic exercises:  Nustep L6 x 10 min UE 10; seat 7 Sit to stand x 10 x 2 sets  Partial squat x 10 x 2 sets Row blue TB x 10 x 2 sets  Shoulder extension blue TB x 10 x 2  Antirotation blue TB x 10 x 2 sets each side  Sidesteps green TB above knees 10 ft x 5 Rt/Lt Hip extension green TB above knees x 10 x 2 Rt/Lt  Hip abduction green TB above knees x 10 x 2 Rt/Lt SLS 30 sec x 2 each side UE fingertip as needed  Standing one foot forward, narrow base UE support as needed x 2 each side x 30 sec  Narrow BOS x 30 sec x 2 no UE support; then with vertical head turns; horizontal head turns x 20 ea Narrow BOS reaching overhead touching cabinet up/side/down x 10 Narrow BOS eyes closed 2 x 30  sec  Toe taps fwd/side/back x 10 ea at counter UE support one hand as needed for balance Modified deadlift 8 in stool 20# KB x 10 reps x 2   Date: 04/10/22:  Therapeutic exercises:  Nustep L6 x 10  min UE 10; seat 7 Sit to stand x 10 x 2 sets  Partial squat x 10 x 2 sets Step up 6 in step x 10 UE support as needed  Row blue TB x 10 x 2 sets  Shoulder extension blue TB x 10 x 2  Antirotation blue TB x 10 x 2 sets each side  SLS 30 sec x 3 each side UE fingertip as needed  Standing one foot forward, narrow base UE support as needed x 2 each side x 30 sec  Narrow BOS x 60 sec no UE support; then with vertical head turns; horizontal head turns x 10 ea Narrow BOS reaching overhead touching cabinet up/side/down x 10 Narrow BOS eyes closed 2 x 30 sec  Toe taps to 12 in step alt LE's x 20 each LE  Toe taps fwd/side/back x 10 ea in doorway UE support one hand as needed for balance Walking with head turns vertically and horizontally 40 ft x 2 each  Modified deadlift 8 in stool 20# KB x 10 reps x 2   PATIENT EDUCATION:  Education details: posture and body mechanics for typical daily postioning, mobility and household tasks' Person educated: Patient Education method: Explanation, Media planner, and Handouts Education comprehension: verbalized understanding and returned demonstration  HOME EXERCISE PROGRAM:  Access Code: [email or web address] URL: [email or web address] Date: 04/10/2022 Prepared by: Gillermo Murdoch  Exercises - Supine Lower Trunk Rotation  - 3 x daily - 7 x weekly - 1 sets - 3 reps - 20 hold - Seated Hamstring Stretch  - 3 x daily - 7 x weekly - 1 sets - 3 reps - 20 hold - Sit to Stand Without Arm Support  - 2 x daily - 7 x weekly - 2 sets - 10 reps - Seated Scapular Retraction  - 5 x daily - 7 x weekly - 1 sets - 10 reps - Seated Upright Posture Correction  - 1 x daily - 7 x weekly - 1 sets - 10 reps - Standing Heel Raise with Chair Support  - 2 x daily - 7 x weekly - 2 sets - 10 reps - Standing Hip Abduction with Counter Support  - 1 x daily - 7 x weekly - 2 sets - 10 reps - Supine Bilateral Shoulder External Rotation with Resistance  - 2 x daily - 7 x weekly - 2 sets -  10 reps - Side Stepping with Resistance at Thighs  - 1 x daily - 7 x weekly - Single Leg Stance  - 2 x daily - 7 x weekly - 2 sets - 5 reps - 20 sec hold - Single Leg Balance with Clock Reach  - 1 x daily - 7 x weekly - 1-2 sets - 10 reps - Mini Squat with Counter Support  - 2 x daily - 7 x weekly - 1-2 sets - 10 reps - 3 sec  hold - Tandem Stance  - 1 x daily - 7 x weekly - 1 sets - 3 reps - 30 sec  hold - Tandem Stance with Eyes Closed  - 1 x daily - 7 x weekly - 1 sets - 3 reps - 30 sec  hold - Modified Deadlift with Pelvic Contraction  - 1 x daily -  7 x weekly - 1 sets - 10 reps  ASSESSMENT:  CLINICAL IMPRESSION: [place] reports improvement in balance with no falls. He has soreness in his legs following therapy but that lasts only a day or so and resolves. He is not consistent with HEP reporting that he is busy with unpacking/moving and appointments  but plans to exercise after Christmas. Patient demonstrated decreased time with 5 times sit to stand test. Continued focused on hip strength and balance activities. Improving exercise tolerance noted each session. Printed exercises for patient's review and use for home. Encouraged home exercise to achieve further gains in strength and balance. Goals of therapy were partially accomplished.   GOALS: Goals reviewed with patient? Yes  SHORT TERM GOALS: Target date: 03/19/2022  Be independent in initial HEP Baseline: Goal status: MET  2.  Verbalize and demonstrate body mechanics modifications for lumbar protection with ADLs and self-care Baseline:  Goal status: partially accomplished   3.  Report > or = to 30% reduction in LBP with ADLs and self-care Baseline: >50% (03/11/22) Goal status: MET   LONG TERM GOALS: Target date: 04/16/2022  Be independent in advanced HEP Baseline:  Goal status: partially accomplished   2.  Improve FOTO to > or = to 60 Baseline: d/c 47 Goal status: not accomplished   3.  Perform 5x sit to stand in < or  = to 13 seconds to improve balance  Baseline: 21 seconds, 18.33 sec 03/13/22 Goal status: partially accomplished  4.  Return to lifting and carrying for household tasks without limitation due to LBP Baseline:  Goal status: accomplished  5.  Report > or = to 70% reduction in LBP with self-care and ADLs Baseline:  Goal status: accomplished  6.  Perform bed mobility with min effort and report no pain with this  Baseline:  Goal status: accomplished   PLAN:  PT FREQUENCY: 1-2x/week  PT DURATION: 8 weeks  PLANNED INTERVENTIONS: Therapeutic exercises, Therapeutic activity, Neuromuscular re-education, Balance training, Gait training, Patient/Family education, Self Care, Joint mobilization, Joint manipulation, Aquatic Therapy, Dry Needling, Electrical stimulation, Spinal manipulation, Spinal mobilization, Cryotherapy, Moist heat, Taping, Traction, Ultrasound, Manual therapy, and Re-evaluation.  PLAN FOR NEXT SESSION: d/c to home exercises program - patient states that he will begin working on exercises in January. He will call with any questions or concerns   Everardo All, PT, MPH  04/15/22 12:34 PM

## 2022-04-24 ENCOUNTER — Ambulatory Visit
Admission: RE | Admit: 2022-04-24 | Discharge: 2022-04-24 | Disposition: A | Discharge status: Home / Self Care | Payer: 59 | Source: Ambulatory Visit | Attending: Home Modifications | Admitting: Home Modifications

## 2022-04-24 DIAGNOSIS — E210 Primary hyperparathyroidism: Secondary | ICD-10-CM

## 2022-05-14 ENCOUNTER — Other Ambulatory Visit: Payer: Self-pay | Admitting: Surgery

## 2022-05-14 DIAGNOSIS — E210 Primary hyperparathyroidism: Secondary | ICD-10-CM

## 2022-06-16 ENCOUNTER — Ambulatory Visit
Admission: RE | Admit: 2022-06-16 | Discharge: 2022-06-16 | Disposition: A | Discharge status: Home / Self Care | Payer: 59 | Source: Ambulatory Visit | Attending: Surgery | Admitting: Surgery

## 2022-06-16 DIAGNOSIS — E210 Primary hyperparathyroidism: Secondary | ICD-10-CM

## 2022-06-16 MED ORDER — IOPAMIDOL (ISOVUE-300) INJECTION 61%
75.0000 mL | Freq: Once | INTRAVENOUS | Status: AC | PRN
  Administered 2022-06-16: 75 mL via INTRAVENOUS

## 2022-07-01 ENCOUNTER — Ambulatory Visit: Payer: Self-pay | Admitting: Surgery

## 2022-07-01 ENCOUNTER — Telehealth: Payer: Self-pay | Admitting: Surgery

## 2022-07-01 NOTE — Progress Notes (Signed)
Telephone call to patient to review CT scan results.  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  Plan minimally invasive parathyroidectomy as an outpatient procedure as we discussed.  Will enter orders and send to scheduling.  Patient understands and agrees to proceed.  [place], MD [facility] [place] practice Office: [phone]

## 2022-07-01 NOTE — Telephone Encounter (Signed)
     Telephone call to patient to review CT scan results.  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  Plan minimally invasive parathyroidectomy as an outpatient procedure as we discussed.  Will enter orders and send to scheduling.  Patient understands and agrees to proceed.  [place], MD [facility] [place] practice Office: [phone]

## 2022-07-07 ENCOUNTER — Encounter: Payer: Self-pay | Admitting: Surgery

## 2022-07-07 DIAGNOSIS — E210 Primary hyperparathyroidism: Secondary | ICD-10-CM | POA: Diagnosis present

## 2022-07-07 NOTE — Progress Notes (Signed)
COVID Vaccine Completed:  Date of COVID positive in last 90 days:  PCP - Orpah Cobb, MD Cardiologist -   Chest x-ray -  EKG - 06/27/22 [facility] req Stress Test -  ECHO -  Cardiac Cath -  Pacemaker/ICD device last checked: Spinal Cord Stimulator:  Bowel Prep -   Sleep Study -  CPAP -   Fasting Blood Sugar -  Checks Blood Sugar _____ times a day  Last dose of GLP1 agonist-  N/A GLP1 instructions:  N/A   Last dose of SGLT-2 inhibitors-  N/A SGLT-2 instructions: N/A   Blood Thinner Instructions: Aspirin Instructions: Last Dose:  Activity level:  Can go up a flight of stairs and perform activities of daily living without stopping and without symptoms of chest pain or shortness of breath.  Able to exercise without symptoms  Unable to go up a flight of stairs without symptoms of     Anesthesia review: left anterior fascicular block  Patient denies shortness of breath, fever, cough and chest pain at PAT appointment  Patient verbalized understanding of instructions that were given to them at the PAT appointment. Patient was also instructed that they will need to review over the PAT instructions again at home before surgery.

## 2022-07-07 NOTE — Patient Instructions (Signed)
SURGICAL WAITING ROOM VISITATION  Patients having surgery or a procedure may have no more than 2 support people in the waiting area - these visitors may rotate.    Children under the age of 33 must have an adult with them who is not the patient.  Due to an increase in RSV and influenza rates and associated hospitalizations, children ages 81 and under may not visit patients in [place].  If the patient needs to stay at the hospital during part of their recovery, the visitor guidelines for inpatient rooms apply. Pre-op nurse will coordinate an appropriate time for 1 support person to accompany patient in pre-op.  This support person may not rotate.    Please refer to the [facility] website for the visitor guidelines for Inpatients (after your surgery is over and you are in a regular room).    Your procedure is scheduled on: 07/10/22   Report to [facility] Main Entrance    Report to admitting at 5:15 AM   Call this number if you have problems the morning of surgery [phone]   Do not eat food :After Midnight.   After Midnight you may have the following liquids until 4:30 AM DAY OF SURGERY  Water Non-Citrus Juices (without pulp, NO RED-Apple, White grape, White cranberry) Black Coffee (NO MILK/CREAM OR CREAMERS, sugar ok)  Clear Tea (NO MILK/CREAM OR CREAMERS, sugar ok) regular and decaf                             Plain Jell-O (NO RED)                                           Fruit ices (not with fruit pulp, NO RED)                                     Popsicles (NO RED)                                                               Sports drinks like Gatorade (NO RED)                      If you have questions, please contact your surgeon's office.   FOLLOW BOWEL PREP AND ANY ADDITIONAL PRE OP INSTRUCTIONS YOU RECEIVED FROM YOUR SURGEON'S OFFICE!!!     Oral Hygiene is also important to reduce your risk of infection.                                     Remember - BRUSH YOUR TEETH THE MORNING OF SURGERY WITH YOUR REGULAR TOOTHPASTE  DENTURES WILL BE REMOVED PRIOR TO SURGERY PLEASE DO NOT APPLY "Poly grip" OR ADHESIVES!!!   Take these medicines the morning of surgery with A SIP OF WATER: Tylenol, Inhalers, Amlodipine, Metoprolol   Bring CPAP mask and tubing day of surgery.  You may not have any metal on your body including jewelry, and body piercing             Do not wear lotions, powders, cologne, or deodorant  Do not shave  48 hours prior to surgery.               Men may shave face and neck.   Do not bring valuables to the hospital. [place].   Contacts, glasses, dentures or bridgework may not be worn into surgery.  DO NOT [place]. PHARMACY WILL DISPENSE MEDICATIONS LISTED ON YOUR MEDICATION LIST TO YOU DURING YOUR ADMISSION [place]!    Patients discharged on the day of surgery will not be allowed to drive home.  Someone NEEDS to stay with you for the first 24 hours after anesthesia.              Please read over the following fact sheets you were given: IF [place] [phone] Schneiders    If you received a COVID test during your pre-op visit  it is requested that you wear a mask when out in public, stay away from anyone that may not be feeling well and notify your surgeon if you develop symptoms. If you test positive for Covid or have been in contact with anyone that has tested positive in the last 10 days please notify you surgeon.    [place] - Preparing for Surgery Before surgery, you can play an important role.  Because skin is not sterile, your skin needs to be as free of germs as possible.  You can reduce the number of germs on your skin by washing with CHG (chlorahexidine gluconate) soap before surgery.  CHG is an antiseptic cleaner which kills  germs and bonds with the skin to continue killing germs even after washing. Please DO NOT use if you have an allergy to CHG or antibacterial soaps.  If your skin becomes reddened/irritated stop using the CHG and inform your nurse when you arrive at Short Stay. Do not shave (including legs and underarms) for at least 48 hours prior to the first CHG shower.  You may shave your face/neck.  Please follow these instructions carefully:  1.  Shower with CHG Soap the night before surgery and the  morning of surgery.  2.  If you choose to wash your hair, wash your hair first as usual with your normal  shampoo.  3.  After you shampoo, rinse your hair and body thoroughly to remove the shampoo.                             4.  Use CHG as you would any other liquid soap.  You can apply chg directly to the skin and wash.  Gently with a scrungie or clean washcloth.  5.  Apply the CHG Soap to your body ONLY FROM THE NECK DOWN.   Do   not use on face/ open                           Wound or open sores. Avoid contact with eyes, ears mouth and   genitals (private parts).  Wash face,  Genitals (private parts) with your normal soap.             6.  Wash thoroughly, paying special attention to the area where your    surgery  will be performed.  7.  Thoroughly rinse your body with warm water from the neck down.  8.  DO NOT shower/wash with your normal soap after using and rinsing off the CHG Soap.                9.  Pat yourself dry with a clean towel.            10.  Wear clean pajamas.            11.  Place clean sheets on your bed the night of your first shower and do not  sleep with pets. Day of Surgery : Do not apply any lotions/deodorants the morning of surgery.  Please wear clean clothes to the hospital/surgery center.  FAILURE TO FOLLOW THESE INSTRUCTIONS MAY RESULT IN THE CANCELLATION OF YOUR SURGERY  PATIENT SIGNATURE_________________________________  NURSE  SIGNATURE__________________________________  ________________________________________________________________________

## 2022-07-07 NOTE — H&P (Signed)
REFERRING PHYSICIAN: Eilene Ghazi, NP  PROVIDER: Glema Takaki Charlotta Newton, MD   Chief Complaint: New Consultation (Primary hyperparathyroidism)  History of Present Illness:  Patient is referred by Eilene Ghazi, NP, for surgical evaluation of primary hyperparathyroidism. Patient has been noted to have elevated serum calcium levels on multiple occasions. Patient most recently had an elevated serum calcium level in November 2023 which was elevated at 12.1. Concurrently his intact PTH level was unsuppressed at 58. 25-hydroxy vitamin D level was normal at 39.4. Further evaluation included an ultrasound examination of the neck which showed a 7 mm lobule on the posterior aspect of the thyroid gland which was felt to mostly represent thyroid tissue and there was no convincing evidence of parathyroid adenoma. Patient subsequently underwent a nuclear medicine parathyroid scan which also showed an area of radiotracer uptake on the posterior inferior margin of the right thyroid lobe. Differential diagnosis included parathyroid adenoma or thyroid adenoma. No additional imaging was performed. Patient has no symptoms related to hypercalcemia. He denies nephrolithiasis. He denies fatigue. He denies bone or joint pain. 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  Review of Systems: A complete review of systems was obtained from the patient. I have reviewed this information and discussed as appropriate with the patient. See HPI as well for other ROS.  Review of Systems  Constitutional: Negative.  HENT: Negative.  Eyes: Negative.  Respiratory: Negative.  Cardiovascular: Negative.  Gastrointestinal: Negative.  Genitourinary: Negative.  Musculoskeletal: Negative.  Skin: Negative.  Neurological: Negative.  Endo/Heme/Allergies: Negative.  Psychiatric/Behavioral: Negative.    Medical History: Past Medical History:  Diagnosis Date  Asthma, unspecified asthma  severity, unspecified whether complicated, unspecified whether persistent  Hypertension  Sleep apnea  Thyroid disease   Patient Active Problem List  Diagnosis  Primary hyperparathyroidism (CMS-HCC)   History reviewed. No pertinent surgical history.   Allergies  Allergen Reactions  Lisinopril Swelling and Other (See Comments)  Angioedema  Prednisone Swelling  Tetracycline Hcl Other (See Comments)   Current Outpatient Medications on File Prior to Visit  Medication Sig Dispense Refill  colestipoL (COLESTID) 1 gram tablet  FUROsemide (LASIX) 20 MG tablet Take 20 mg by mouth once daily  meloxicam (MOBIC) 15 MG tablet Take 15 mg by mouth once daily as needed  amLODIPine (NORVASC) 10 MG tablet Take 10 mg by mouth once daily  losartan (COZAAR) 100 MG tablet Take 100 mg by mouth once daily  metoprolol succinate (TOPROL-XL) 25 MG XL tablet Take 25 mg by mouth once daily  spironolactone (ALDACTONE) 25 MG tablet Take 25 mg by mouth once daily   No current facility-administered medications on file prior to visit.   Family History  Problem Relation Age of Onset  Diabetes Mother    Social History   Tobacco Use  Smoking Status Former  Types: Cigarettes  Smokeless Tobacco Never    Social History   Socioeconomic History  Marital status: Married  Tobacco Use  Smoking status: Former  Types: Cigarettes  Smokeless tobacco: Never  Substance and Sexual Activity  Alcohol use: Yes  Comment: daily  Drug use: Never   Objective:   Vitals:  BP: 117/75  Pulse: 97  Temp: 36.7 C (98 F)  SpO2: 97%  Weight: (!) 112 kg (247 lb)  Height: 167.6 cm ('5\' 6"'$ )   Body mass index is 39.87 kg/m.  Physical Exam   GENERAL APPEARANCE Comfortable, no acute issues Development: normal Gross deformities: none  SKIN Rash, lesions, ulcers:  none Induration, erythema: none Nodules: none palpable  EYES Conjunctiva and lids: normal Pupils: equal and reactive  EARS, NOSE, MOUTH,  THROAT External ears: no lesion or deformity External nose: no lesion or deformity Hearing: grossly normal  NECK Symmetric: yes Trachea: midline Thyroid: no palpable nodules in the thyroid bed  CHEST Respiratory effort: normal Retraction or accessory muscle use: no Breath sounds: normal bilaterally Rales, rhonchi, wheeze: none  CARDIOVASCULAR Auscultation: regular rhythm, normal rate Murmurs: none Pulses: radial pulse 2+ palpable Lower extremity edema: none  ABDOMEN Not assessed  GENITOURINARY/RECTAL Not assessed  MUSCULOSKELETAL Station and gait: normal Digits and nails: no clubbing or cyanosis Muscle strength: grossly normal all extremities Range of motion: grossly normal all extremities Deformity: none  LYMPHATIC Cervical: none palpable Supraclavicular: none palpable  PSYCHIATRIC Oriented to person, place, and time: yes Mood and affect: normal for situation Judgment and insight: appropriate for situation   Assessment and Plan:   Primary hyperparathyroidism (CMS-HCC)  Patient is referred for evaluation of suspected primary hyperparathyroidism.  Patient provided with a copy of "Parathyroid Surgery: Treatment for Your Parathyroid Gland Problem", published by [employer], 12 pages. Book reviewed and explained to patient during visit today.  Today we reviewed his clinical history. We reviewed laboratory studies as well as his imaging studies including an ultrasound and a nuclear medicine parathyroid scan. Patient denies any complications related to hypercalcemia.  I would like to obtain a 24-hour urine collection for calcium to rule out Faxon. I would also like to obtain a 4D CT scan of the neck in order to better evaluate the area on the posterior right thyroid lobe. If this appears to be a parathyroid adenoma, then I believe the patient would be a good candidate for minimally invasive outpatient surgery. We discussed that briefly today.  Patient understands and  agrees to proceed with 24-hour urine collection and with CT scanning. We will notify him with the results of the studies when they are available.   Armandina Gemma, MD [facility] [place] practice Office: [phone]

## 2022-07-08 ENCOUNTER — Encounter (HOSPITAL_COMMUNITY)
Admission: RE | Admit: 2022-07-08 | Discharge: 2022-07-08 | Disposition: A | Discharge status: Home / Self Care | Payer: 59 | Source: Ambulatory Visit | Attending: Surgery | Admitting: Surgery

## 2022-07-08 ENCOUNTER — Encounter (HOSPITAL_COMMUNITY): Payer: Self-pay

## 2022-07-08 ENCOUNTER — Other Ambulatory Visit: Payer: Self-pay

## 2022-07-08 VITALS — BP 117/75 | HR 57 | Temp 97.8°F | Resp 16 | Ht 69.0 in | Wt 239.0 lb

## 2022-07-08 DIAGNOSIS — I10 Essential (primary) hypertension: Secondary | ICD-10-CM | POA: Insufficient documentation

## 2022-07-08 DIAGNOSIS — Z01812 Encounter for preprocedural laboratory examination: Secondary | ICD-10-CM | POA: Insufficient documentation

## 2022-07-08 HISTORY — DX: Cardiac arrhythmia, unspecified: I49.9

## 2022-07-08 LAB — BASIC METABOLIC PANEL
Anion gap: 11 (ref 5–15)
BUN: 6 mg/dL — ABNORMAL LOW (ref 8–23)
CO2: 28 mmol/L (ref 22–32)
Calcium: 11.3 mg/dL — ABNORMAL HIGH (ref 8.9–10.3)
Chloride: 100 mmol/L (ref 98–111)
Creatinine, Ser: 1.08 mg/dL (ref 0.61–1.24)
GFR, Estimated: >60 mL/min (ref >60)
Glucose, Bld: 86 mg/dL (ref 70–99)
Potassium: 3.5 mmol/L (ref 3.5–5.1)
Sodium: 139 mmol/L (ref 135–145)

## 2022-07-10 ENCOUNTER — Ambulatory Visit (HOSPITAL_COMMUNITY)
Admission: RE | Admit: 2022-07-10 | Discharge: 2022-07-10 | Disposition: A | Discharge status: Home / Self Care | Payer: 59 | Attending: Surgery | Admitting: Surgery

## 2022-07-10 ENCOUNTER — Other Ambulatory Visit: Payer: Self-pay

## 2022-07-10 ENCOUNTER — Ambulatory Visit (HOSPITAL_COMMUNITY): Payer: 59 | Admitting: Certified Registered Nurse Anesthetist

## 2022-07-10 ENCOUNTER — Ambulatory Visit (HOSPITAL_BASED_OUTPATIENT_CLINIC_OR_DEPARTMENT_OTHER): Payer: 59 | Admitting: Certified Registered Nurse Anesthetist

## 2022-07-10 ENCOUNTER — Encounter (HOSPITAL_COMMUNITY)
Admission: RE | Disposition: A | Discharge status: Home / Self Care | Payer: Self-pay | Source: Home / Self Care | Attending: Surgery

## 2022-07-10 ENCOUNTER — Encounter (HOSPITAL_COMMUNITY): Payer: Self-pay | Admitting: Surgery

## 2022-07-10 DIAGNOSIS — E210 Primary hyperparathyroidism: Secondary | ICD-10-CM

## 2022-07-10 DIAGNOSIS — G4730 Sleep apnea, unspecified: Secondary | ICD-10-CM | POA: Diagnosis not present

## 2022-07-10 DIAGNOSIS — I10 Essential (primary) hypertension: Secondary | ICD-10-CM | POA: Insufficient documentation

## 2022-07-10 DIAGNOSIS — J45909 Unspecified asthma, uncomplicated: Secondary | ICD-10-CM | POA: Diagnosis not present

## 2022-07-10 DIAGNOSIS — Z87891 Personal history of nicotine dependence: Secondary | ICD-10-CM | POA: Insufficient documentation

## 2022-07-10 DIAGNOSIS — Z09 Encounter for follow-up examination after completed treatment for conditions other than malignant neoplasm: Secondary | ICD-10-CM | POA: Diagnosis not present

## 2022-07-10 HISTORY — PX: PARATHYROIDECTOMY: SHX19

## 2022-07-10 SURGERY — PARATHYROIDECTOMY
Anesthesia: General | Laterality: Right

## 2022-07-10 MED ORDER — ONDANSETRON HCL 4 MG/2ML IJ SOLN
INTRAMUSCULAR | Status: DC | PRN
  Administered 2022-07-10: 4 mg via INTRAVENOUS

## 2022-07-10 MED ORDER — FENTANYL CITRATE (PF) 100 MCG/2ML IJ SOLN
INTRAMUSCULAR | Status: AC
  Filled 2022-07-10: qty 2

## 2022-07-10 MED ORDER — EPHEDRINE 5 MG/ML INJ
INTRAVENOUS | Status: AC
  Filled 2022-07-10: qty 5

## 2022-07-10 MED ORDER — LIDOCAINE HCL (PF) 2 % IJ SOLN
INTRAMUSCULAR | Status: AC
  Filled 2022-07-10: qty 5

## 2022-07-10 MED ORDER — DEXAMETHASONE SODIUM PHOSPHATE 10 MG/ML IJ SOLN
INTRAMUSCULAR | Status: AC
  Filled 2022-07-10: qty 1

## 2022-07-10 MED ORDER — SUGAMMADEX SODIUM 200 MG/2ML IV SOLN
INTRAVENOUS | Status: DC | PRN
  Administered 2022-07-10: 200 mg via INTRAVENOUS

## 2022-07-10 MED ORDER — PROMETHAZINE HCL 25 MG/ML IJ SOLN: 6.2500 mg | INTRAMUSCULAR | Status: DC | PRN

## 2022-07-10 MED ORDER — TRAMADOL HCL 50 MG PO TABS: 50.0000 mg | ORAL_TABLET | Freq: Four times a day (QID) | ORAL | 0 refills | Status: AC | PRN

## 2022-07-10 MED ORDER — CHLORHEXIDINE GLUCONATE 0.12 % MT SOLN
15.0000 mL | Freq: Once | OROMUCOSAL | Status: AC
  Administered 2022-07-10: 15 mL via OROMUCOSAL

## 2022-07-10 MED ORDER — ROCURONIUM BROMIDE 10 MG/ML (PF) SYRINGE
PREFILLED_SYRINGE | INTRAVENOUS | Status: DC | PRN
  Administered 2022-07-10: 80 mg via INTRAVENOUS

## 2022-07-10 MED ORDER — ONDANSETRON HCL 4 MG/2ML IJ SOLN
INTRAMUSCULAR | Status: AC
  Filled 2022-07-10: qty 2

## 2022-07-10 MED ORDER — LIDOCAINE 2% (20 MG/ML) 5 ML SYRINGE
INTRAMUSCULAR | Status: DC | PRN
  Administered 2022-07-10: 60 mg via INTRAVENOUS

## 2022-07-10 MED ORDER — BUPIVACAINE HCL 0.25 % IJ SOLN
INTRAMUSCULAR | Status: DC | PRN
  Administered 2022-07-10: 10 mL

## 2022-07-10 MED ORDER — ORAL CARE MOUTH RINSE: 15.0000 mL | Freq: Once | OROMUCOSAL | Status: AC

## 2022-07-10 MED ORDER — 0.9 % SODIUM CHLORIDE (POUR BTL) OPTIME
TOPICAL | Status: DC | PRN
  Administered 2022-07-10: 1000 mL

## 2022-07-10 MED ORDER — CHLORHEXIDINE GLUCONATE CLOTH 2 % EX PADS: 6.0000 | MEDICATED_PAD | Freq: Once | CUTANEOUS | Status: DC

## 2022-07-10 MED ORDER — BUPIVACAINE HCL (PF) 0.25 % IJ SOLN
INTRAMUSCULAR | Status: AC
  Filled 2022-07-10: qty 30

## 2022-07-10 MED ORDER — OXYCODONE HCL 5 MG PO TABS: 5.0000 mg | ORAL_TABLET | Freq: Once | ORAL | Status: DC | PRN

## 2022-07-10 MED ORDER — AMISULPRIDE (ANTIEMETIC) 5 MG/2ML IV SOLN: 10.0000 mg | Freq: Once | INTRAVENOUS | Status: DC | PRN

## 2022-07-10 MED ORDER — FENTANYL CITRATE (PF) 100 MCG/2ML IJ SOLN
INTRAMUSCULAR | Status: DC | PRN
  Administered 2022-07-10 (×4): 50 ug via INTRAVENOUS

## 2022-07-10 MED ORDER — HEMOSTATIC AGENTS (NO CHARGE) OPTIME
TOPICAL | Status: DC | PRN
  Administered 2022-07-10: 1

## 2022-07-10 MED ORDER — HYDROMORPHONE HCL 2 MG/ML IJ SOLN
INTRAMUSCULAR | Status: AC
  Filled 2022-07-10: qty 1

## 2022-07-10 MED ORDER — ROCURONIUM BROMIDE 10 MG/ML (PF) SYRINGE
PREFILLED_SYRINGE | INTRAVENOUS | Status: AC
  Filled 2022-07-10: qty 10

## 2022-07-10 MED ORDER — PROPOFOL 10 MG/ML IV BOLUS
INTRAVENOUS | Status: DC | PRN
  Administered 2022-07-10: 200 mg via INTRAVENOUS

## 2022-07-10 MED ORDER — HYDROMORPHONE HCL 1 MG/ML IJ SOLN
INTRAMUSCULAR | Status: DC | PRN
  Administered 2022-07-10: 1 mg via INTRAVENOUS

## 2022-07-10 MED ORDER — EPHEDRINE SULFATE-NACL 50-0.9 MG/10ML-% IV SOSY
PREFILLED_SYRINGE | INTRAVENOUS | Status: DC | PRN
  Administered 2022-07-10: 5 mg via INTRAVENOUS

## 2022-07-10 MED ORDER — CEFAZOLIN SODIUM-DEXTROSE 2-4 GM/100ML-% IV SOLN
2.0000 g | INTRAVENOUS | Status: AC
  Administered 2022-07-10: 2 g via INTRAVENOUS
  Filled 2022-07-10: qty 100

## 2022-07-10 MED ORDER — HYDROMORPHONE HCL 1 MG/ML IJ SOLN: 0.2500 mg | INTRAMUSCULAR | Status: DC | PRN

## 2022-07-10 MED ORDER — MEPERIDINE HCL 50 MG/ML IJ SOLN: 6.2500 mg | INTRAMUSCULAR | Status: DC | PRN

## 2022-07-10 MED ORDER — OXYCODONE HCL 5 MG/5ML PO SOLN: 5.0000 mg | Freq: Once | ORAL | Status: DC | PRN

## 2022-07-10 MED ORDER — LACTATED RINGERS IV SOLN: INTRAVENOUS | Status: DC

## 2022-07-10 SURGICAL SUPPLY — 36 items
ADH SKN CLS APL DERMABOND .7 (GAUZE/BANDAGES/DRESSINGS) ×1
APL PRP STRL LF DISP 70% ISPRP (MISCELLANEOUS) ×1
ATTRACTOMAT 16X20 MAGNETIC DRP (DRAPES) ×1 IMPLANT
BAG COUNTER SPONGE SURGICOUNT (BAG) ×1 IMPLANT
BAG SPNG CNTER NS LX DISP (BAG) ×1
BLADE SURG 15 STRL LF DISP TIS (BLADE) ×1 IMPLANT
BLADE SURG 15 STRL SS (BLADE) ×1
CHLORAPREP W/TINT 26 (MISCELLANEOUS) ×1 IMPLANT
CLIP TI MEDIUM 6 (CLIP) ×2 IMPLANT
CLIP TI WIDE RED SMALL 6 (CLIP) ×2 IMPLANT
COVER SURGICAL LIGHT HANDLE (MISCELLANEOUS) ×1 IMPLANT
DERMABOND ADVANCED .7 DNX12 (GAUZE/BANDAGES/DRESSINGS) ×1 IMPLANT
DRAPE LAPAROTOMY T 98X78 PEDS (DRAPES) ×1 IMPLANT
DRAPE UTILITY XL STRL (DRAPES) ×1 IMPLANT
ELECT PENCIL ROCKER SW 15FT (MISCELLANEOUS) IMPLANT
ELECT REM PT RETURN 15FT ADLT (MISCELLANEOUS) ×1 IMPLANT
GAUZE 4X4 16PLY ~~LOC~~+RFID DBL (SPONGE) ×1 IMPLANT
GLOVE SURG ORTHO 8.0 STRL STRW (GLOVE) ×1 IMPLANT
GOWN STRL REUS W/ TWL XL LVL3 (GOWN DISPOSABLE) ×3 IMPLANT
GOWN STRL REUS W/TWL XL LVL3 (GOWN DISPOSABLE) ×3
HEMOSTAT SURGICEL 2X4 FIBR (HEMOSTASIS) ×1 IMPLANT
ILLUMINATOR WAVEGUIDE N/F (MISCELLANEOUS) IMPLANT
KIT BASIN OR (CUSTOM PROCEDURE TRAY) ×1 IMPLANT
KIT TURNOVER KIT A (KITS) IMPLANT
NDL HYPO 25X1 1.5 SAFETY (NEEDLE) ×1 IMPLANT
NEEDLE HYPO 25X1 1.5 SAFETY (NEEDLE) ×1 IMPLANT
PACK BASIC VI WITH GOWN DISP (CUSTOM PROCEDURE TRAY) ×1 IMPLANT
PENCIL SMOKE EVACUATOR (MISCELLANEOUS) ×1 IMPLANT
SHEARS HARMONIC 9CM CVD (BLADE) IMPLANT
SUT MNCRL AB 4-0 PS2 18 (SUTURE) ×1 IMPLANT
SUT VIC AB 3-0 SH 18 (SUTURE) ×1 IMPLANT
SYR BULB IRRIG 60ML STRL (SYRINGE) ×1 IMPLANT
SYR CONTROL 10ML LL (SYRINGE) ×1 IMPLANT
TOWEL OR 17X26 10 PK STRL BLUE (TOWEL DISPOSABLE) ×1 IMPLANT
TOWEL OR NON WOVEN STRL DISP B (DISPOSABLE) ×1 IMPLANT
TUBING CONNECTING 10 (TUBING) ×1 IMPLANT

## 2022-07-10 NOTE — Transfer of Care (Signed)
Immediate Anesthesia Transfer of Care Note  Patient: Julienne Bernson Debord  Procedure(s) Performed: RIGHT INFERIOR PARATHYROIDECTOMY (Right)  Patient Location: PACU  Anesthesia Type:General  Level of Consciousness: awake and patient cooperative  Airway & Oxygen Therapy: Patient Spontanous Breathing and Patient connected to face mask  Post-op Assessment: Report given to RN and Post -op Vital signs reviewed and stable  Post vital signs: Reviewed and stable  Last Vitals:  Vitals Value Taken Time  BP 152/74 07/10/22 0939  Temp    Pulse 67 07/10/22 0942  Resp 12 07/10/22 0942  SpO2 98 % 07/10/22 0942  Vitals shown include unvalidated device data.  Last Pain:  Vitals:   07/10/22 0604  TempSrc: Oral         Complications: No notable events documented.

## 2022-07-10 NOTE — Progress Notes (Signed)
Patient did not take Beta Blocker this morning.  Dr. Sabra Heck notified.  No orders given

## 2022-07-10 NOTE — Discharge Instructions (Addendum)
[facility] - Dr. Todd Gerkin  THYROID & PARATHYROID SURGERY:  POST-OP INSTRUCTIONS  Always review the instruction sheet provided by the hospital nurse at discharge.  A prescription for pain medication may be sent to your pharmacy at the time of discharge.  Take your pain medication as prescribed.  If narcotic pain medicine is not needed, then you may take acetaminophen (Tylenol) or ibuprofen (Advil) as needed for pain or soreness.  Take your normal home medications as prescribed unless otherwise directed.  If you need a refill on your pain medication, please contact the office during regular business hours.  Prescriptions will not be processed by the office after 5:00PM or on weekends.  Start with a light diet upon arrival home, such as soup and crackers or toast.  Be sure to drink plenty of fluids.  Resume your normal diet the day after surgery.  Most patients will experience some swelling and bruising on the chest and neck area.  Ice packs will help for the first 48 hours after arriving home.  Swelling and bruising will take several days to resolve.   It is common to experience some constipation after surgery.  Increasing fluid intake and taking a stool softener (Colace) will usually help to prevent this problem.  A mild laxative (Milk of Magnesia or Miralax) should be taken according to package directions if there has been no bowel movement after 48 hours.  Dermabond glue covers your incision. This seals the wound and you may shower at any time. The Dermabond will remain in place for about a week.  You may gradually remove the glue when it loosens around the edges.  If you need to loosen the Dermabond for removal, apply a layer of Vaseline to the wound for 15 minutes and then remove with a Kleenex. Your sutures are under the skin and will not show - they will dissolve on their own.  You may resume light daily activities beginning the day after discharge (such as self-care,  walking, climbing stairs), gradually increasing activities as tolerated. You may have sexual intercourse when it is comfortable. Refrain from any heavy lifting or straining until approved by your doctor. You may drive when you no longer are taking prescription pain medication, you can comfortably wear a seatbelt, and you can safely maneuver your car and apply the brakes.  You will see your doctor in the office for a follow-up appointment approximately three weeks after your surgery.  Make sure that you call for this appointment within a day or two after you arrive home to insure a convenient appointment time. Please have any requested laboratory tests performed a few days prior to your office visit so that the results will be available at your follow up appointment.  WHEN TO CALL THE [facility] OFFICE: -- Fever greater than 101.5 -- Inability to urinate -- Nausea and/or vomiting - persistent -- Extreme swelling or bruising -- Continued bleeding from incision -- Increased pain, redness, or drainage from the incision -- Difficulty swallowing or breathing -- Muscle cramping or spasms -- Numbness or tingling in hands or around lips  The clinic staff is available to answer your questions during regular business hours.  Please don't hesitate to call and ask to speak to one of the nurses if you have concerns.  [facility] OFFICE: [phone] (24 hours)  Please sign up for MyChart accounts. This will allow you to communicate directly with my nurse or myself without having to call the office. It will also allow you   to view your test results. You will need to enroll in MyChart for my office ([facility]) and for the hospital ([facility]).  Todd Gerkin, MD [facility] practice 

## 2022-07-10 NOTE — Interval H&P Note (Signed)
History and Physical Interval Note:  07/10/2022 7:05 AM  Donald [name]  has presented today for surgery, with the diagnosis of PRIMARY HYPERPARATHYROIDISM.  The various methods of treatment have been discussed with the patient and family. After consideration of risks, benefits and other options for treatment, the patient has consented to    Procedure(s): RIGHT INFERIOR PARATHYROIDECTOMY (Right) as a surgical intervention.    The patient's history has been reviewed, patient examined, no change in status, stable for surgery.  I have reviewed the patient's chart and labs.  Questions were answered to the patient's satisfaction.    Armandina Gemma, [facility] [place] practice Office: [place]

## 2022-07-10 NOTE — Anesthesia Postprocedure Evaluation (Signed)
Anesthesia Post Note  Patient: Donald [name]  Procedure(s) Performed: RIGHT INFERIOR PARATHYROIDECTOMY (Right)     Patient location during evaluation: PACU Anesthesia Type: General Level of consciousness: awake and alert Pain management: pain level controlled Vital Signs Assessment: post-procedure vital signs reviewed and stable Respiratory status: spontaneous breathing, nonlabored ventilation and respiratory function stable Cardiovascular status: blood pressure returned to baseline and stable Postop Assessment: no apparent nausea or vomiting Anesthetic complications: no   No notable events documented.  Last Vitals:  Vitals:   07/10/22 1045 07/10/22 1100  BP: (!) 158/75 (!) 158/74  Pulse: 60 68  Resp: (!) 8 20  Temp:  (!) 36.3 C  SpO2: 93% 93%    Last Pain:  Vitals:   07/10/22 1100  TempSrc:   PainSc: 0-No pain                 Lynda Rainwater

## 2022-07-10 NOTE — Anesthesia Procedure Notes (Signed)
Procedure Name: Intubation Date/Time: 07/10/2022 7:40 AM  Performed by: Claudia Desanctis, CRNAPre-anesthesia Checklist: Patient identified, Emergency Drugs available, Suction available and Patient being monitored Patient Re-evaluated:Patient Re-evaluated prior to induction Oxygen Delivery Method: Circle system utilized Preoxygenation: Pre-oxygenation with 100% oxygen Induction Type: IV induction Ventilation: Mask ventilation without difficulty Laryngoscope Size: 2 and Miller Grade View: Grade I Tube type: Oral Tube size: 7.5 mm Number of attempts: 1 Airway Equipment and Method: Stylet Placement Confirmation: ETT inserted through vocal cords under direct vision, positive ETCO2 and breath sounds checked- equal and bilateral Secured at: 22 cm Tube secured with: Tape Dental Injury: Teeth and Oropharynx as per pre-operative assessment

## 2022-07-10 NOTE — Op Note (Signed)
OPERATIVE REPORT - PARATHYROIDECTOMY  Preoperative diagnosis: Primary hyperparathyroidism  Postop diagnosis: Same  Procedure: Right inferior minimally invasive parathyroidectomy  Surgeon:  Armandina Gemma, MD  Anesthesia: General endotracheal  Estimated blood loss: Minimal  Preparation: ChloraPrep  Indications: Patient is referred by Eilene Ghazi, NP, for surgical evaluation of primary hyperparathyroidism. Patient has been noted to have elevated serum calcium levels on multiple occasions. Patient most recently had an elevated serum calcium level in November 2023 which was elevated at 12.1. Concurrently his intact PTH level was unsuppressed at 58. 25-hydroxy vitamin D level was normal at 39.4. Further evaluation included an ultrasound examination of the neck which showed a 7 mm lobule on the posterior aspect of the thyroid gland which was felt to mostly represent thyroid tissue and there was no convincing evidence of parathyroid adenoma. Patient subsequently underwent a nuclear medicine parathyroid scan which also showed an area of radiotracer uptake on the posterior inferior margin of the right thyroid lobe. Differential diagnosis included parathyroid adenoma or thyroid adenoma. No additional imaging was performed. Patient has no symptoms related to hypercalcemia. He denies nephrolithiasis. He denies fatigue. He denies bone or joint pain. 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   Procedure: The patient was prepared in the pre-operative holding area. The patient was brought to the operating room and placed in a supine position on the operating room table. Following administration of general anesthesia, the patient was positioned and then prepped and draped in the usual strict aseptic fashion. After ascertaining that an adequate level of anesthesia been achieved, a neck incision was made with a #15 blade. Dissection was carried through subcutaneous tissues and  platysma. Hemostasis was obtained with the electrocautery. Skin flaps were developed circumferentially and a Weitlander retractor was placed for exposure.  Strap muscles were incised in the midline. Strap muscles were reflected laterally exposing the thyroid lobe. With gentle blunt dissection the thyroid lobe was mobilized.  Dissection was carried posteriorly and an enlarged parathyroid gland was identified.  The parathyroid gland was markedly enlarged and appeared somewhat lobulated.  It extended from the inferior pole of the thyroid along the lateral aspect of the airway down to the lateral aspect of the esophagus at the level of the precervical fascia.  The gland was densely adherent to the underlying structures requiring meticulous dissection.  Vascular tributaries were divided between small and medium ligaclips.  Recurrent laryngeal nerve was identified along the lateral and posterior aspect of the parathyroid gland.  It was gently mobilized and preserved.  The gland was completely excised and submitted to pathology where frozen section confirmed hypercellular parathyroid tissue.  Neck was irrigated with warm saline and good hemostasis was noted. Fibrillar was placed in the operative field. Strap muscles were approximated in the midline with interrupted 3-0 Vicryl sutures. Platysma was closed with interrupted 3-0 Vicryl sutures. Marcaine was infiltrated circumferentially. Skin was closed with a running 4-0 Monocryl subcuticular suture. Wound was washed and dried and Dermabond was applied. Patient was awakened from anesthesia and brought to the recovery room. The patient tolerated the procedure well.   Armandina Gemma, [facility] Office: [phone]

## 2022-07-10 NOTE — Anesthesia Preprocedure Evaluation (Signed)
Anesthesia Evaluation  Patient identified by MRN, date of birth, ID band Patient awake    Reviewed: Allergy & Precautions, NPO status , Patient's Chart, lab work & pertinent test results  Airway Mallampati: II  TM Distance: >3 FB Neck ROM: Full    Dental no notable dental hx.    Pulmonary asthma , sleep apnea , former smoker   Pulmonary exam normal breath sounds clear to auscultation       Cardiovascular hypertension, Pt. on medications Normal cardiovascular exam Rhythm:Regular Rate:Normal     Neuro/Psych negative neurological ROS  negative psych ROS   GI/Hepatic negative GI ROS, Neg liver ROS,,,  Endo/Other    Renal/GU negative Renal ROS  negative genitourinary   Musculoskeletal  (+) Arthritis , Osteoarthritis,    Abdominal  (+) + obese  Peds negative pediatric ROS (+)  Hematology negative hematology ROS (+)   Anesthesia Other Findings   Reproductive/Obstetrics negative OB ROS                             Anesthesia Physical Anesthesia Plan  ASA: 2  Anesthesia Plan: General   Post-op Pain Management: Dilaudid IV   Induction: Intravenous  PONV Risk Score and Plan: 2 and Ondansetron, Midazolam and Treatment may vary due to age or medical condition  Airway Management Planned: Oral ETT  Additional Equipment:   Intra-op Plan:   Post-operative Plan: Extubation in OR  Informed Consent: I have reviewed the patients History and Physical, chart, labs and discussed the procedure including the risks, benefits and alternatives for the proposed anesthesia with the patient or authorized representative who has indicated his/her understanding and acceptance.     Dental advisory given  Plan Discussed with: CRNA and Surgeon  Anesthesia Plan Comments:         Anesthesia Quick Evaluation

## 2022-07-11 ENCOUNTER — Encounter (HOSPITAL_COMMUNITY): Payer: Self-pay | Admitting: Surgery

## 2022-07-18 LAB — SURGICAL PATHOLOGY

## 2022-07-21 NOTE — Progress Notes (Signed)
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  There are positive margins, but "complete excision" is likely not possible as this would involve the trachea.  Will review with patient in office and arrange follow up.  This will require ongoing monitoring.  Armandina Gemma, MD [facility] A [place] practice Office: [phone]

## 2023-02-26 ENCOUNTER — Other Ambulatory Visit (HOSPITAL_COMMUNITY): Payer: Self-pay | Admitting: Family Medicine

## 2023-02-26 ENCOUNTER — Ambulatory Visit (HOSPITAL_COMMUNITY): Admission: RE | Admit: 2023-02-26 | Payer: 59 | Source: Ambulatory Visit

## 2023-02-26 DIAGNOSIS — M7989 Other specified soft tissue disorders: Secondary | ICD-10-CM
